# Patient Record
Sex: Female | Born: 1975 | Race: White | Hispanic: No | Marital: Single | Smoking: Current every day smoker
Health system: Southern US, Community
[De-identification: ages and names within clinical notes are randomized; demographics above are authoritative.]

## PROBLEM LIST (undated history)

## (undated) DIAGNOSIS — F410 Panic disorder [episodic paroxysmal anxiety] without agoraphobia: Secondary | ICD-10-CM

## (undated) DIAGNOSIS — F419 Anxiety disorder, unspecified: Secondary | ICD-10-CM

## (undated) DIAGNOSIS — K5790 Diverticulosis of intestine, part unspecified, without perforation or abscess without bleeding: Secondary | ICD-10-CM

## (undated) HISTORY — PX: OOPHORECTOMY: SHX86

---

## 2003-11-22 ENCOUNTER — Emergency Department: Payer: Self-pay | Admitting: Emergency Medicine

## 2004-02-09 ENCOUNTER — Emergency Department: Payer: Self-pay | Admitting: Emergency Medicine

## 2004-03-24 ENCOUNTER — Emergency Department: Payer: Self-pay | Admitting: Emergency Medicine

## 2004-06-19 ENCOUNTER — Emergency Department: Payer: Self-pay | Admitting: Emergency Medicine

## 2004-12-20 ENCOUNTER — Emergency Department: Payer: Self-pay | Admitting: Emergency Medicine

## 2005-05-24 ENCOUNTER — Emergency Department: Payer: Self-pay | Admitting: Emergency Medicine

## 2006-03-01 ENCOUNTER — Other Ambulatory Visit: Payer: Self-pay

## 2006-03-01 ENCOUNTER — Emergency Department: Payer: Self-pay | Admitting: Emergency Medicine

## 2006-04-04 ENCOUNTER — Ambulatory Visit: Payer: Self-pay | Admitting: Family Medicine

## 2006-09-20 ENCOUNTER — Emergency Department: Payer: Self-pay | Admitting: Internal Medicine

## 2006-12-24 ENCOUNTER — Emergency Department: Payer: Self-pay | Admitting: Emergency Medicine

## 2009-12-10 ENCOUNTER — Emergency Department (HOSPITAL_BASED_OUTPATIENT_CLINIC_OR_DEPARTMENT_OTHER): Admission: EM | Admit: 2009-12-10 | Discharge: 2009-12-10 | Payer: Self-pay | Admitting: Emergency Medicine

## 2009-12-10 ENCOUNTER — Ambulatory Visit: Payer: Self-pay | Admitting: Interventional Radiology

## 2010-02-10 ENCOUNTER — Emergency Department (HOSPITAL_BASED_OUTPATIENT_CLINIC_OR_DEPARTMENT_OTHER)
Admission: EM | Admit: 2010-02-10 | Discharge: 2010-02-10 | Payer: Self-pay | Source: Home / Self Care | Admitting: Emergency Medicine

## 2010-03-16 ENCOUNTER — Emergency Department (HOSPITAL_BASED_OUTPATIENT_CLINIC_OR_DEPARTMENT_OTHER)
Admission: EM | Admit: 2010-03-16 | Discharge: 2010-03-16 | Disposition: A | Discharge status: Home / Self Care | Payer: Self-pay | Attending: Emergency Medicine | Admitting: Emergency Medicine

## 2010-03-16 DIAGNOSIS — J45909 Unspecified asthma, uncomplicated: Secondary | ICD-10-CM | POA: Insufficient documentation

## 2010-03-16 DIAGNOSIS — M549 Dorsalgia, unspecified: Secondary | ICD-10-CM | POA: Insufficient documentation

## 2010-03-16 DIAGNOSIS — IMO0001 Reserved for inherently not codable concepts without codable children: Secondary | ICD-10-CM | POA: Insufficient documentation

## 2010-03-16 DIAGNOSIS — F17200 Nicotine dependence, unspecified, uncomplicated: Secondary | ICD-10-CM | POA: Insufficient documentation

## 2010-05-10 ENCOUNTER — Emergency Department (HOSPITAL_BASED_OUTPATIENT_CLINIC_OR_DEPARTMENT_OTHER)
Admission: EM | Admit: 2010-05-10 | Discharge: 2010-05-10 | Disposition: A | Discharge status: Home / Self Care | Payer: Self-pay | Attending: Emergency Medicine | Admitting: Emergency Medicine

## 2010-05-10 DIAGNOSIS — N9489 Other specified conditions associated with female genital organs and menstrual cycle: Secondary | ICD-10-CM | POA: Insufficient documentation

## 2010-05-10 DIAGNOSIS — N946 Dysmenorrhea, unspecified: Secondary | ICD-10-CM | POA: Insufficient documentation

## 2010-05-10 DIAGNOSIS — J45909 Unspecified asthma, uncomplicated: Secondary | ICD-10-CM | POA: Insufficient documentation

## 2010-05-10 LAB — URINALYSIS, ROUTINE W REFLEX MICROSCOPIC
Ketones, ur: NEGATIVE mg/dL
Nitrite: NEGATIVE
pH: 5.5 (ref 5.0–8.0)

## 2010-06-20 ENCOUNTER — Emergency Department (INDEPENDENT_AMBULATORY_CARE_PROVIDER_SITE_OTHER): Payer: Self-pay

## 2010-06-20 ENCOUNTER — Emergency Department (HOSPITAL_BASED_OUTPATIENT_CLINIC_OR_DEPARTMENT_OTHER)
Admission: EM | Admit: 2010-06-20 | Discharge: 2010-06-20 | Disposition: A | Discharge status: Home / Self Care | Payer: Self-pay | Attending: Emergency Medicine | Admitting: Emergency Medicine

## 2010-06-20 DIAGNOSIS — F17200 Nicotine dependence, unspecified, uncomplicated: Secondary | ICD-10-CM | POA: Insufficient documentation

## 2010-06-20 DIAGNOSIS — S61209A Unspecified open wound of unspecified finger without damage to nail, initial encounter: Secondary | ICD-10-CM | POA: Insufficient documentation

## 2010-06-20 DIAGNOSIS — J45909 Unspecified asthma, uncomplicated: Secondary | ICD-10-CM | POA: Insufficient documentation

## 2011-01-05 ENCOUNTER — Emergency Department (HOSPITAL_BASED_OUTPATIENT_CLINIC_OR_DEPARTMENT_OTHER)
Admission: EM | Admit: 2011-01-05 | Discharge: 2011-01-05 | Disposition: A | Discharge status: Home / Self Care | Payer: Self-pay | Attending: Emergency Medicine | Admitting: Emergency Medicine

## 2011-01-05 ENCOUNTER — Encounter: Payer: Self-pay | Admitting: Family Medicine

## 2011-01-05 DIAGNOSIS — J45909 Unspecified asthma, uncomplicated: Secondary | ICD-10-CM | POA: Insufficient documentation

## 2011-01-05 DIAGNOSIS — R51 Headache: Secondary | ICD-10-CM | POA: Insufficient documentation

## 2011-01-05 DIAGNOSIS — J111 Influenza due to unidentified influenza virus with other respiratory manifestations: Secondary | ICD-10-CM | POA: Insufficient documentation

## 2011-01-05 MED ORDER — ALBUTEROL SULFATE HFA 108 (90 BASE) MCG/ACT IN AERS
2.0000 | INHALATION_SPRAY | Freq: Four times a day (QID) | RESPIRATORY_TRACT | Status: DC
  Administered 2011-01-05: 19:00:00 via RESPIRATORY_TRACT

## 2011-01-05 MED ORDER — ALBUTEROL SULFATE HFA 108 (90 BASE) MCG/ACT IN AERS
INHALATION_SPRAY | RESPIRATORY_TRACT | Status: AC
  Filled 2011-01-05: qty 6.7

## 2011-01-05 MED ORDER — OXYCODONE-ACETAMINOPHEN 5-325 MG PO TABS: 2.0000 | ORAL_TABLET | ORAL | Status: AC | PRN

## 2011-01-05 NOTE — ED Provider Notes (Signed)
History     CSN: [id number] Arrival date & time: 01/05/2011  6:33 PM   First MD Initiated Contact with Patient 01/05/11 1836      Chief Complaint  Patient presents with  . Sore Throat  . Headache   35 year old female with a history of asthma. She states that she began having sore throat and body aches and "fever" approximately 2 days ago. She's also had generalized malaise and cough. She states her coworker recently had influenza. The patient is afebrile in triage. She's had no difficulty swallowing, no chest pain, no neck pain, no rash.  (Consider location/radiation/quality/duration/timing/severity/associated sxs/prior treatment) HPI  Past Medical History  Diagnosis Date  . Asthma     Past Surgical History  Procedure Date  . Oophorectomy     No family history on file.  History  Substance Use Topics  . Smoking status: Current Everyday Smoker  . Smokeless tobacco: Not on file  . Alcohol Use: No    OB History    Grav Para Term Preterm Abortions TAB SAB Ect Mult Living                  Review of Systems  All other systems reviewed and are negative.    Allergies  Review of patient's allergies indicates no known allergies.  Home Medications   Current Outpatient Rx  Name Route Sig Dispense Refill  . ALBUTEROL SULFATE (2.5 MG/3ML) 0.083% IN NEBU Nebulization Take 2.5 mg by nebulization every 6 (six) hours as needed. For shortness of breath and wheezing    . FLUTICASONE PROPIONATE 50 MCG/ACT NA SUSP Nasal Place 2 sprays into the nose daily.      . IBUPROFEN 200 MG PO TABS Oral Take 800 mg by mouth every 6 (six) hours as needed. For pain     . LORATADINE 10 MG PO TABS Oral Take 10 mg by mouth daily.        BP 106/68  Pulse 76  Temp(Src) 98.3 F (36.8 C) (Oral)  Resp 16  Ht 5\' 5"  (1.651 m)  Wt 165 lb (74.844 kg)  BMI 27.46 kg/m2  SpO2 100%  LMP 11/26/2010  Physical Exam  Nursing note and vitals reviewed. Constitutional: She is oriented to person, place,  and time. She appears well-developed and well-nourished.  HENT:  Head: Normocephalic and atraumatic.  Mouth/Throat: Oropharynx is clear and moist. No oropharyngeal exudate.  Eyes: Conjunctivae and EOM are normal. Pupils are equal, round, and reactive to light.  Neck: Neck supple.  Cardiovascular: Normal rate and regular rhythm.  Exam reveals no gallop and no friction rub.   No murmur heard. Pulmonary/Chest: Effort normal and breath sounds normal. No respiratory distress. She has no wheezes. She has no rales. She exhibits no tenderness.  Abdominal: Soft. Bowel sounds are normal. She exhibits no distension. There is no tenderness. There is no rebound and no guarding.  Musculoskeletal: Normal range of motion.  Lymphadenopathy:    She has no cervical adenopathy.  Neurological: She is alert and oriented to person, place, and time. No cranial nerve deficit. Coordination normal.  Skin: Skin is warm and dry. No rash noted.  Psychiatric: She has a normal mood and affect.    ED Course  Procedures (including critical care time)  Labs Reviewed - No data to display No results found.   No diagnosis found.    MDM  Pt is seen and examined;  Initial history and physical completed.  Will follow.  Juni Glaab A. Patrica Duel, MD 01/05/11 0454

## 2011-01-05 NOTE — ED Notes (Signed)
Pt c/o sore throat, headache and fever onset today. Pt sts she took ibuprofen 2 hrs ago.

## 2011-03-25 ENCOUNTER — Emergency Department (HOSPITAL_BASED_OUTPATIENT_CLINIC_OR_DEPARTMENT_OTHER)
Admission: EM | Admit: 2011-03-25 | Discharge: 2011-03-25 | Disposition: A | Discharge status: Home / Self Care | Payer: Self-pay | Attending: Emergency Medicine | Admitting: Emergency Medicine

## 2011-03-25 ENCOUNTER — Encounter (HOSPITAL_BASED_OUTPATIENT_CLINIC_OR_DEPARTMENT_OTHER): Payer: Self-pay | Admitting: *Deleted

## 2011-03-25 DIAGNOSIS — J329 Chronic sinusitis, unspecified: Secondary | ICD-10-CM | POA: Insufficient documentation

## 2011-03-25 DIAGNOSIS — J45909 Unspecified asthma, uncomplicated: Secondary | ICD-10-CM | POA: Insufficient documentation

## 2011-03-25 DIAGNOSIS — F17200 Nicotine dependence, unspecified, uncomplicated: Secondary | ICD-10-CM | POA: Insufficient documentation

## 2011-03-25 MED ORDER — AMOXICILLIN 500 MG PO CAPS: 500.0000 mg | ORAL_CAPSULE | Freq: Three times a day (TID) | ORAL | Status: AC

## 2011-03-25 NOTE — ED Notes (Signed)
Facial pain, head pain, nasal congestion, lightheaded, dizziness, and ear pain. Symptoms x 1 week.

## 2011-03-25 NOTE — Discharge Instructions (Signed)
Sinusitis Sinuses are air pockets within the bones of your face. The growth of bacteria within a sinus leads to infection. The infection prevents the sinuses from draining. This infection is called sinusitis. SYMPTOMS   There will be different areas of pain depending on which sinuses have become infected.  The maxillary sinuses often produce pain beneath the eyes.     Frontal sinusitis may cause pain in the middle of the forehead and above the eyes.  Other problems (symptoms) include:  Toothaches.     Colored, pus-like (purulent) drainage from the nose.     Swelling, warmth, and tenderness over the sinus areas may be signs of infection.  TREATMENT   Sinusitis is most often determined by an exam.X-rays may be taken. If x-rays have been taken, make sure you obtain your results or find out how you are to obtain them. Your caregiver may give you medications (antibiotics). These are medications that will help kill the bacteria causing the infection. You may also be given a medication (decongestant) that helps to reduce sinus swelling.   HOME CARE INSTRUCTIONS    Only take over-the-counter or prescription medicines for pain, discomfort, or fever as directed by your caregiver.     Drink extra fluids. Fluids help thin the mucus so your sinuses can drain more easily.     Applying either moist heat or ice packs to the sinus areas may help relieve discomfort.     Use saline nasal sprays to help moisten your sinuses. The sprays can be found at your local drugstore.  SEEK IMMEDIATE MEDICAL CARE IF:  You have a fever.     You have increasing pain, severe headaches, or toothache.     You have nausea, vomiting, or drowsiness.     You develop unusual swelling around the face or trouble seeing.  MAKE SURE YOU:    Understand these instructions.     Will watch your condition.     Will get help right away if you are not doing well or get worse.  Document Released: 01/10/2005 Document Revised:  09/22/2010 Document Reviewed: 08/09/2006 [employer] Patient Information 2012 [employer].Sinusitis Sinuses are air pockets within the bones of your face. The growth of bacteria within a sinus leads to infection. The infection prevents the sinuses from draining. This infection is called sinusitis. SYMPTOMS   There will be different areas of pain depending on which sinuses have become infected.  The maxillary sinuses often produce pain beneath the eyes.     Frontal sinusitis may cause pain in the middle of the forehead and above the eyes.  Other problems (symptoms) include:  Toothaches.     Colored, pus-like (purulent) drainage from the nose.     Swelling, warmth, and tenderness over the sinus areas may be signs of infection.  TREATMENT   Sinusitis is most often determined by an exam.X-rays may be taken. If x-rays have been taken, make sure you obtain your results or find out how you are to obtain them. Your caregiver may give you medications (antibiotics). These are medications that will help kill the bacteria causing the infection. You may also be given a medication (decongestant) that helps to reduce sinus swelling.   HOME CARE INSTRUCTIONS    Only take over-the-counter or prescription medicines for pain, discomfort, or fever as directed by your caregiver.     Drink extra fluids. Fluids help thin the mucus so your sinuses can drain more easily.     Applying either moist heat or ice   packs to the sinus areas may help relieve discomfort.     Use saline nasal sprays to help moisten your sinuses. The sprays can be found at your local drugstore.  SEEK IMMEDIATE MEDICAL CARE IF:  You have a fever.     You have increasing pain, severe headaches, or toothache.     You have nausea, vomiting, or drowsiness.     You develop unusual swelling around the face or trouble seeing.  MAKE SURE YOU:    Understand these instructions.     Will watch your condition.     Will get help right away  if you are not doing well or get worse.  Document Released: 01/10/2005 Document Revised: 09/22/2010 Document Reviewed: 08/09/2006 [employer] Patient Information 2012 [employer]. 

## 2011-03-25 NOTE — ED Provider Notes (Signed)
History     CSN: [id number]  Arrival date & time 03/25/11  1928   First MD Initiated Contact with Patient 03/25/11 1950      Chief Complaint  Patient presents with  . URI    (Consider location/radiation/quality/duration/timing/severity/associated sxs/prior treatment) Patient is a 36 y.o. female presenting with cough. The history is provided by the patient. No language interpreter was used.  Cough This is a new problem. The current episode started more than 1 week ago. The problem occurs constantly. The problem has been gradually worsening. The cough is productive of brown sputum. There has been no fever. The fever has been present for 5 days or more. Associated symptoms include ear pain. She has tried decongestants for the symptoms. The treatment provided no relief. Risk factors: none. She is a smoker. Her past medical history is significant for asthma. Her past medical history does not include pneumonia.    Past Medical History  Diagnosis Date  . Asthma     Past Surgical History  Procedure Date  . Oophorectomy     No family history on file.  History  Substance Use Topics  . Smoking status: Current Everyday Smoker  . Smokeless tobacco: Not on file  . Alcohol Use: No    OB History    Grav Para Term Preterm Abortions TAB SAB Ect Mult Living                  Review of Systems  HENT: Positive for ear pain.   Respiratory: Positive for cough.   All other systems reviewed and are negative.    Allergies  Review of patient's allergies indicates no known allergies.  Home Medications   Current Outpatient Rx  Name Route Sig Dispense Refill  . FLUTICASONE PROPIONATE 50 MCG/ACT NA SUSP Nasal Place 2 sprays into the nose daily.      . IBUPROFEN 200 MG PO TABS Oral Take 800 mg by mouth every 6 (six) hours as needed. For pain     . LORATADINE 10 MG PO TABS Oral Take 10 mg by mouth daily.      . ALBUTEROL SULFATE (2.5 MG/3ML) 0.083% IN NEBU Nebulization Take 2.5 mg by  nebulization every 6 (six) hours as needed. For shortness of breath and wheezing      BP 113/57  Pulse 83  Temp(Src) 98 F (36.7 C) (Oral)  Resp 20  SpO2 98%  Physical Exam  Nursing note and vitals reviewed. Constitutional: She is oriented to person, place, and time. She appears well-developed and well-nourished.  HENT:  Head: Normocephalic and atraumatic.  Right Ear: External ear normal.  Left Ear: External ear normal.  Nose: Nose normal.  Mouth/Throat: Oropharynx is clear and moist.       Tender bilat maxillary sinuses  Eyes: Conjunctivae and EOM are normal. Pupils are equal, round, and reactive to light.  Neck: Normal range of motion. Neck supple.  Cardiovascular: Normal rate.   Pulmonary/Chest: Effort normal.  Abdominal: Soft.  Musculoskeletal: Normal range of motion.  Neurological: She is alert and oriented to person, place, and time. She has normal reflexes.  Skin: Skin is warm.  Psychiatric: She has a normal mood and affect.    ED Course  Procedures (including critical care time)  Labs Reviewed - No data to display No results found.   No diagnosis found.    MDM   Results for orders placed during the hospital encounter of 05/10/10  URINALYSIS, ROUTINE W REFLEX MICROSCOPIC  Component Value Range   Color, Urine YELLOW  YELLOW    APPearance CLOUDY (*) CLEAR    Specific Gravity, Urine 1.026  1.005 - 1.030    pH 5.5  5.0 - 8.0    Glucose, UA NEGATIVE  NEGATIVE (mg/dL)   Hgb urine dipstick NEGATIVE  NEGATIVE    Bilirubin Urine NEGATIVE  NEGATIVE    Ketones, ur NEGATIVE  NEGATIVE (mg/dL)   Protein, ur NEGATIVE  NEGATIVE (mg/dL)   Urobilinogen, UA 1.0  0.0 - 1.0 (mg/dL)   Nitrite NEGATIVE  NEGATIVE    Leukocytes, UA    NEGATIVE    Value: NEGATIVE MICROSCOPIC NOT DONE ON URINES WITH NEGATIVE PROTEIN, BLOOD, LEUKOCYTES, NITRITE, OR GLUCOSE <1000 mg/dL.  PREGNANCY, URINE      Component Value Range   Preg Test, Ur       Value: NEGATIVE            THE  SENSITIVITY OF THIS     METHODOLOGY IS >24 mIU/mL   No results found.         Langston Masker, [place] 03/25/11 2032

## 2011-03-25 NOTE — ED Provider Notes (Signed)
Medical screening examination/treatment/procedure(s) were performed by non-physician practitioner and as supervising physician I was immediately available for consultation/collaboration.   Javari Bufkin, MD 03/25/11 2339 

## 2011-04-12 ENCOUNTER — Encounter (HOSPITAL_BASED_OUTPATIENT_CLINIC_OR_DEPARTMENT_OTHER): Payer: Self-pay | Admitting: *Deleted

## 2011-04-12 ENCOUNTER — Emergency Department (HOSPITAL_BASED_OUTPATIENT_CLINIC_OR_DEPARTMENT_OTHER)
Admission: EM | Admit: 2011-04-12 | Discharge: 2011-04-12 | Disposition: A | Discharge status: Home / Self Care | Payer: Self-pay | Attending: Emergency Medicine | Admitting: Emergency Medicine

## 2011-04-12 DIAGNOSIS — J45909 Unspecified asthma, uncomplicated: Secondary | ICD-10-CM | POA: Insufficient documentation

## 2011-04-12 DIAGNOSIS — J019 Acute sinusitis, unspecified: Secondary | ICD-10-CM

## 2011-04-12 DIAGNOSIS — F17200 Nicotine dependence, unspecified, uncomplicated: Secondary | ICD-10-CM | POA: Insufficient documentation

## 2011-04-12 DIAGNOSIS — R070 Pain in throat: Secondary | ICD-10-CM | POA: Insufficient documentation

## 2011-04-12 MED ORDER — SULFAMETHOXAZOLE-TRIMETHOPRIM 800-160 MG PO TABS: 1.0000 | ORAL_TABLET | Freq: Two times a day (BID) | ORAL | Status: DC

## 2011-04-12 MED ORDER — TRAMADOL HCL 50 MG PO TABS: 50.0000 mg | ORAL_TABLET | Freq: Four times a day (QID) | ORAL | Status: DC | PRN

## 2011-04-12 MED ORDER — SULFAMETHOXAZOLE-TRIMETHOPRIM 800-160 MG PO TABS: 1.0000 | ORAL_TABLET | Freq: Two times a day (BID) | ORAL | Status: AC

## 2011-04-12 MED ORDER — TRAMADOL HCL 50 MG PO TABS: 50.0000 mg | ORAL_TABLET | Freq: Four times a day (QID) | ORAL | Status: AC | PRN

## 2011-04-12 NOTE — ED Notes (Signed)
Reports treated for same symptoms one month ago took antibiotics prescribed as soon as done with meds symptoms came back

## 2011-04-12 NOTE — ED Provider Notes (Signed)
History     CSN: [id number]  Arrival date & time 04/12/11  1141   First MD Initiated Contact with Patient 04/12/11 1310      Chief Complaint  Patient presents with  . Nasal Congestion  . Sore Throat    (Consider location/radiation/quality/duration/timing/severity/associated sxs/prior treatment) Patient is a 36 y.o. female presenting with pharyngitis. The history is provided by the patient. No language interpreter was used.  Sore Throat This is a recurrent problem. The current episode started more than 1 month ago. The problem occurs constantly. The problem has been unchanged. Associated symptoms include a sore throat. Pertinent negatives include no fever. The symptoms are aggravated by coughing and smoking. She has tried nothing for the symptoms. The treatment provided no relief.  Pt complains of continued sorethroat and sinus congestion.   Past Medical History  Diagnosis Date  . Asthma     Past Surgical History  Procedure Date  . Oophorectomy     History reviewed. No pertinent family history.  History  Substance Use Topics  . Smoking status: Current Everyday Smoker  . Smokeless tobacco: Not on file  . Alcohol Use: No    OB History    Grav Para Term Preterm Abortions TAB SAB Ect Mult Living                  Review of Systems  Constitutional: Negative for fever.  HENT: Positive for sore throat.   All other systems reviewed and are negative.    Allergies  Review of patient's allergies indicates no known allergies.  Home Medications   Current Outpatient Rx  Name Route Sig Dispense Refill  . ALBUTEROL SULFATE (2.5 MG/3ML) 0.083% IN NEBU Nebulization Take 2.5 mg by nebulization every 6 (six) hours as needed. For shortness of breath and wheezing    . FLUTICASONE PROPIONATE 50 MCG/ACT NA SUSP Nasal Place 2 sprays into the nose daily.      . IBUPROFEN 200 MG PO TABS Oral Take 800 mg by mouth every 6 (six) hours as needed. For pain     . LORATADINE 10 MG PO TABS  Oral Take 10 mg by mouth daily.        BP 108/63  Pulse 81  Temp(Src) 98.2 F (36.8 C) (Oral)  Resp 20  Wt 182 lb (82.555 kg)  SpO2 99%  LMP 03/29/2011  Physical Exam  Vitals reviewed. Constitutional: She appears well-developed and well-nourished.  HENT:  Head: Normocephalic and atraumatic.  Right Ear: External ear normal.  Left Ear: External ear normal.  Nose: Nose normal.  Mouth/Throat: Oropharynx is clear and moist.  Eyes: Conjunctivae are normal. Pupils are equal, round, and reactive to light.  Neck: Normal range of motion. Neck supple.  Cardiovascular: Normal rate.   Pulmonary/Chest: Effort normal.  Abdominal: Soft.  Musculoskeletal: Normal range of motion.  Neurological: She is alert.  Skin: Skin is warm.  Psychiatric: She has a normal mood and affect.    ED Course  Procedures (including critical care time)  Labs Reviewed - No data to display No results found.   No diagnosis found.    MDM  I will treat with bactim.  And ultram for symptoms.  I will refer to Dr. Suszanne Conners for evaluation        Emily Stuart, [place] 04/12/11 [phone]

## 2011-04-12 NOTE — ED Provider Notes (Signed)
Medical screening examination/treatment/procedure(s) were performed by non-physician practitioner and as supervising physician I was immediately available for consultation/collaboration.   Rolan Bucco, MD 04/12/11 [phone]

## 2011-04-12 NOTE — Discharge Instructions (Signed)

## 2011-11-12 ENCOUNTER — Emergency Department (HOSPITAL_BASED_OUTPATIENT_CLINIC_OR_DEPARTMENT_OTHER)
Admission: EM | Admit: 2011-11-12 | Discharge: 2011-11-13 | Disposition: A | Discharge status: Home / Self Care | Payer: Self-pay | Attending: Emergency Medicine | Admitting: Emergency Medicine

## 2011-11-12 ENCOUNTER — Encounter (HOSPITAL_BASED_OUTPATIENT_CLINIC_OR_DEPARTMENT_OTHER): Payer: Self-pay | Admitting: *Deleted

## 2011-11-12 DIAGNOSIS — F411 Generalized anxiety disorder: Secondary | ICD-10-CM | POA: Insufficient documentation

## 2011-11-12 DIAGNOSIS — Z79899 Other long term (current) drug therapy: Secondary | ICD-10-CM | POA: Insufficient documentation

## 2011-11-12 DIAGNOSIS — F17200 Nicotine dependence, unspecified, uncomplicated: Secondary | ICD-10-CM | POA: Insufficient documentation

## 2011-11-12 DIAGNOSIS — F4320 Adjustment disorder, unspecified: Secondary | ICD-10-CM

## 2011-11-12 DIAGNOSIS — J45909 Unspecified asthma, uncomplicated: Secondary | ICD-10-CM | POA: Insufficient documentation

## 2011-11-12 HISTORY — DX: Anxiety disorder, unspecified: F41.9

## 2011-11-12 HISTORY — DX: Panic disorder (episodic paroxysmal anxiety): F41.0

## 2011-11-12 NOTE — ED Notes (Signed)
Pt states she has been feeling anxious for about a month, but has gotten worse the past few days. "Going through a lot". Denies suicidal thoughts. Tearful.

## 2011-11-13 MED ORDER — LORAZEPAM 1 MG PO TABS: 1.0000 mg | ORAL_TABLET | Freq: Every evening | ORAL | Status: AC | PRN

## 2011-11-13 NOTE — ED Notes (Signed)
rx x 1 given for ativan with reference guide

## 2011-11-13 NOTE — ED Provider Notes (Signed)
History  This chart was scribed for Emily Seamen, MD by Albertha Ghee Rifaie. This patient was seen in room MH09/MH09 and the patient's care was started at 12:30PM.    CSN: [id number]  Arrival date & time 11/12/11  2248   None     Chief Complaint  Patient presents with  . Panic Attack     HPI  Emily Stuart is a 36 y.o. female who presents to the Emergency Department complaining of a month of feeling anxious that got worse the past few days that is associated with trouble sleeping and poor appetite, SOB (when feeling especially anxious) and diarrhea. Pt has a remote hx of panic attack with similar symptoms. She reports having a lot of stress at home and having personal problems. Pt is tearful. She denies SI/HI. She doesn't have a PCP. Pt is a current everyday smoker but denies alcohol or drug use. The symptoms are moderate to severe and she is requesting something to help her sleep as her insomnia is affecting her work.    Past Medical History  Diagnosis Date  . Asthma   . Anxiety   . Panic attacks     Past Surgical History  Procedure Date  . Oophorectomy     History reviewed. No pertinent family history.  History  Substance Use Topics  . Smoking status: Current Every Day Smoker  . Smokeless tobacco: Not on file  . Alcohol Use: No   No OB history provided.  Review of Systems  All other systems reviewed and are negative.    Allergies  Review of patient's allergies indicates no known allergies.  Home Medications   Current Outpatient Rx  Name Route Sig Dispense Refill  . ALBUTEROL SULFATE (2.5 MG/3ML) 0.083% IN NEBU Nebulization Take 2.5 mg by nebulization every 6 (six) hours as needed. For shortness of breath and wheezing    . FLUTICASONE PROPIONATE 50 MCG/ACT NA SUSP Nasal Place 2 sprays into the nose daily.      . IBUPROFEN 200 MG PO TABS Oral Take 800 mg by mouth every 6 (six) hours as needed. For pain     . LORATADINE 10 MG PO TABS Oral Take 10 mg by  mouth daily.        BP 127/80  Pulse 102  Temp 98.3 F (36.8 C) (Oral)  Resp 20  Ht 5\' 5"  (1.651 m)  Wt 155 lb (70.308 kg)  BMI 25.79 kg/m2  SpO2 98%  LMP 10/11/2011  Physical Exam General: Well-developed, well-nourished female in no acute distress; appearance consistent with age of record HENT: normocephalic, atraumatic Eyes: pupils equal round and reactive to light; extraocular muscles intact Neck: supple Heart: regular rate and rhythm; no murmurs, rubs or gallops Lungs: clear to auscultation bilaterally Abdomen: soft; nondistended; nontender; no masses or hepatosplenomegaly; bowel sounds present Extremities: No deformity; full range of motion; pulses normal; no edema Neurologic: Awake, alert and oriented; motor function intact in all extremities and symmetric; no facial droop Skin: Warm and dry Psychiatric:  Tearful; depressed with congruent affect; no SI/HI  ED Course  Procedures (including critical care time)   DIAGNOSTIC STUDIES: Oxygen Saturation is 98% on room air, normal by my interpretation.    COORDINATION OF CARE: PM Discussed treatment plan with pt at bedside and pt agreed to plan.   MDM  We'll refer her for outpatient behavioral health followup. She denies drug use, suicidal ideation or homicidal ideation at this time. She was advised to return should this change.  I personally performed the services described in this documentation, which was scribed in my presence.  The recorded information has been reviewed and considered.    Emily Seamen, MD 11/13/11 [phone]

## 2011-12-12 ENCOUNTER — Encounter (HOSPITAL_BASED_OUTPATIENT_CLINIC_OR_DEPARTMENT_OTHER): Payer: Self-pay | Admitting: *Deleted

## 2011-12-12 ENCOUNTER — Emergency Department (HOSPITAL_BASED_OUTPATIENT_CLINIC_OR_DEPARTMENT_OTHER)
Admission: EM | Admit: 2011-12-12 | Discharge: 2011-12-12 | Disposition: A | Discharge status: Home / Self Care | Payer: 59 | Attending: Emergency Medicine | Admitting: Emergency Medicine

## 2011-12-12 DIAGNOSIS — J069 Acute upper respiratory infection, unspecified: Secondary | ICD-10-CM | POA: Insufficient documentation

## 2011-12-12 DIAGNOSIS — F410 Panic disorder [episodic paroxysmal anxiety] without agoraphobia: Secondary | ICD-10-CM | POA: Insufficient documentation

## 2011-12-12 DIAGNOSIS — J45909 Unspecified asthma, uncomplicated: Secondary | ICD-10-CM | POA: Insufficient documentation

## 2011-12-12 DIAGNOSIS — R51 Headache: Secondary | ICD-10-CM | POA: Insufficient documentation

## 2011-12-12 DIAGNOSIS — F17200 Nicotine dependence, unspecified, uncomplicated: Secondary | ICD-10-CM | POA: Insufficient documentation

## 2011-12-12 DIAGNOSIS — F411 Generalized anxiety disorder: Secondary | ICD-10-CM | POA: Insufficient documentation

## 2011-12-12 MED ORDER — TRAMADOL HCL 50 MG PO TABS: 50.0000 mg | ORAL_TABLET | Freq: Four times a day (QID) | ORAL | Status: AC | PRN

## 2011-12-12 MED ORDER — PSEUDOEPHEDRINE HCL 60 MG PO TABS: 60.0000 mg | ORAL_TABLET | Freq: Four times a day (QID) | ORAL | Status: AC | PRN

## 2011-12-12 NOTE — ED Notes (Signed)
Pt c/o head congestion h/a and facial pressure x 3 days

## 2011-12-12 NOTE — ED Notes (Signed)
Pt reports heqd pressure pain OTC med ineffective Day QUIL

## 2011-12-12 NOTE — ED Provider Notes (Signed)
History  This chart was scribed for Emily Girardot B. Bernette Mayers, MD by Ardeen Jourdain, ED Scribe. This patient was seen in room MH01/MH01 and the patient's care was started at 2225.  CSN: [id number]  Arrival date & time 12/12/11  2214   First MD Initiated Contact with Patient 12/12/11 2225      Chief Complaint  Patient presents with  . Nasal Congestion  . Headache     The history is provided by the patient. No language interpreter was used.    Cassandria Stuart is a 36 y.o. female who presents to the Emergency Department complaining of head congestion with associated sore throat, nausea and facial pressure. She denies fever, rash, emesis, diarrhea, urinary incontinence, bowel incontinence and cough. She states the pain started 2 days ago and has been gradually worsening. She reports that the pain is located in her forehead and around her eyes. She denies sick contact. She reports taking Dayquil with no relief. She has a h/o asthma, anxiety and panic attacks. She is a current everyday smoker but denies alcohol use.   Past Medical History  Diagnosis Date  . Asthma   . Anxiety   . Panic attacks     Past Surgical History  Procedure Date  . Oophorectomy     History reviewed. No pertinent family history.  History  Substance Use Topics  . Smoking status: Current Every Day Smoker -- 1.0 packs/day  . Smokeless tobacco: Not on file  . Alcohol Use: No   No OB history available.   Review of Systems  All other systems reviewed and are negative.    Allergies  Review of patient's allergies indicates no known allergies.  Home Medications   Current Outpatient Rx  Name  Route  Sig  Dispense  Refill  . ALBUTEROL SULFATE (2.5 MG/3ML) 0.083% IN NEBU   Nebulization   Take 2.5 mg by nebulization every 6 (six) hours as needed. For shortness of breath and wheezing         . FLUTICASONE PROPIONATE 50 MCG/ACT NA SUSP   Nasal   Place 2 sprays into the nose daily.           .  IBUPROFEN 200 MG PO TABS   Oral   Take 800 mg by mouth every 6 (six) hours as needed. For pain          . LORATADINE 10 MG PO TABS   Oral   Take 10 mg by mouth daily.           Marland Kitchen LORAZEPAM 1 MG PO TABS   Oral   Take 1-2 tablets (1-2 mg total) by mouth at bedtime as needed (for sleep).   15 tablet   0     Triage Vitals: BP 122/68  Pulse 80  Temp 98.2 F (36.8 C) (Oral)  Resp 18  Ht 5\' 4"  (1.626 m)  Wt 155 lb (70.308 kg)  BMI 26.61 kg/m2  SpO2 98%  LMP 11/14/2011  Physical Exam  Nursing note and vitals reviewed. Constitutional: She is oriented to person, place, and time. She appears well-developed and well-nourished.  HENT:  Head: Normocephalic and atraumatic.       No tenderness to percussion in maxillary frontal sinus  Eyes: EOM are normal. Pupils are equal, round, and reactive to light.  Neck: Normal range of motion. Neck supple.  Cardiovascular: Normal rate, normal heart sounds and intact distal pulses.   Pulmonary/Chest: Effort normal and breath sounds normal.  Abdominal: Bowel sounds are normal.  She exhibits no distension. There is no tenderness.  Musculoskeletal: Normal range of motion. She exhibits no edema and no tenderness.  Lymphadenopathy:    She has no cervical adenopathy.  Neurological: She is alert and oriented to person, place, and time. She has normal strength. No cranial nerve deficit or sensory deficit.  Skin: Skin is warm and dry. No rash noted.  Psychiatric: She has a normal mood and affect.    ED Course  Procedures (including critical care time)  DIAGNOSTIC STUDIES: Oxygen Saturation is 98% on room air, normal by my interpretation.    COORDINATION OF CARE:  10:27 PM: Discussed treatment plan with pt at bedside and pt agreed to plan.    Labs Reviewed - No data to display No results found.   No diagnosis found.    MDM  Exam unremarkable. Symptoms for 2 days without concern for bacterial sinusitis. Advised symptomatic treatment.  PCP followup.       I personally performed the services described in this documentation, which was scribed in my presence. The recorded information has been reviewed and is accurate.     Faizon Capozzi B. Bernette Mayers, MD 12/12/11 2236

## 2011-12-29 ENCOUNTER — Other Ambulatory Visit: Payer: Self-pay

## 2011-12-29 ENCOUNTER — Emergency Department (HOSPITAL_BASED_OUTPATIENT_CLINIC_OR_DEPARTMENT_OTHER)
Admission: EM | Admit: 2011-12-29 | Discharge: 2011-12-29 | Disposition: A | Discharge status: Home / Self Care | Payer: 59 | Attending: Emergency Medicine | Admitting: Emergency Medicine

## 2011-12-29 ENCOUNTER — Encounter (HOSPITAL_BASED_OUTPATIENT_CLINIC_OR_DEPARTMENT_OTHER): Payer: Self-pay | Admitting: *Deleted

## 2011-12-29 DIAGNOSIS — F411 Generalized anxiety disorder: Secondary | ICD-10-CM | POA: Insufficient documentation

## 2011-12-29 DIAGNOSIS — R110 Nausea: Secondary | ICD-10-CM

## 2011-12-29 DIAGNOSIS — J45909 Unspecified asthma, uncomplicated: Secondary | ICD-10-CM | POA: Insufficient documentation

## 2011-12-29 DIAGNOSIS — R5381 Other malaise: Secondary | ICD-10-CM | POA: Insufficient documentation

## 2011-12-29 DIAGNOSIS — R51 Headache: Secondary | ICD-10-CM | POA: Insufficient documentation

## 2011-12-29 DIAGNOSIS — Z79899 Other long term (current) drug therapy: Secondary | ICD-10-CM | POA: Insufficient documentation

## 2011-12-29 DIAGNOSIS — F17200 Nicotine dependence, unspecified, uncomplicated: Secondary | ICD-10-CM | POA: Insufficient documentation

## 2011-12-29 DIAGNOSIS — Z3202 Encounter for pregnancy test, result negative: Secondary | ICD-10-CM | POA: Insufficient documentation

## 2011-12-29 DIAGNOSIS — R42 Dizziness and giddiness: Secondary | ICD-10-CM | POA: Insufficient documentation

## 2011-12-29 DIAGNOSIS — F410 Panic disorder [episodic paroxysmal anxiety] without agoraphobia: Secondary | ICD-10-CM | POA: Insufficient documentation

## 2011-12-29 LAB — URINALYSIS, ROUTINE W REFLEX MICROSCOPIC
Bilirubin Urine: NEGATIVE
Hgb urine dipstick: NEGATIVE
Protein, ur: NEGATIVE mg/dL
Urobilinogen, UA: 1 mg/dL (ref 0.0–1.0)

## 2011-12-29 LAB — PREGNANCY, URINE: Preg Test, Ur: NEGATIVE

## 2011-12-29 LAB — URINE MICROSCOPIC-ADD ON

## 2011-12-29 MED ORDER — ONDANSETRON 8 MG PO TBDP
8.0000 mg | ORAL_TABLET | Freq: Once | ORAL | Status: AC
  Administered 2011-12-29: 8 mg via ORAL
  Filled 2011-12-29: qty 1

## 2011-12-29 NOTE — ED Provider Notes (Signed)
History     CSN: [id number]  Arrival date & time 12/29/11  1434   First MD Initiated Contact with Patient 12/29/11 1503      Chief Complaint  Patient presents with  . Nausea     Patient is a 36 y.o. female presenting with weakness. The history is provided by the patient.  Weakness The primary symptoms include headaches, dizziness and nausea. Primary symptoms do not include syncope, loss of consciousness, seizures, focal weakness, loss of sensation, fever or vomiting. Episode onset: today. Episode duration: all day. The symptoms are unchanged. The neurological symptoms are diffuse.  The headache is associated with weakness. The headache is not associated with photophobia or loss of balance.  Dizziness also occurs with nausea and weakness. Dizziness does not occur with vomiting.  Additional symptoms include weakness. Additional symptoms do not include lower back pain, loss of balance, photophobia, hearing loss or vertigo.  pt reports she has felt dizzy for "weeks" but no syncope or LOC.  Reports feeling faint, no vertigo She reports mild headache No visual loss No current cp/sob No abd pain No vomiting/diarrhea She reports nausea onset today No fever No dysuria No vag bleeding or discharge No rectal bleeding   Past Medical History  Diagnosis Date  . Asthma   . Anxiety   . Panic attacks     Past Surgical History  Procedure Date  . Oophorectomy     No family history on file.  History  Substance Use Topics  . Smoking status: Current Every Day Smoker -- 1.0 packs/day    Types: Cigarettes  . Smokeless tobacco: Not on file  . Alcohol Use: No    OB History    Grav Para Term Preterm Abortions TAB SAB Ect Mult Living                  Review of Systems  Constitutional: Negative for fever.  HENT: Negative for hearing loss.   Eyes: Negative for photophobia.  Respiratory: Negative for shortness of breath.   Cardiovascular: Negative for syncope.  Gastrointestinal:  Positive for nausea. Negative for vomiting.  Genitourinary: Negative for vaginal bleeding and vaginal discharge.  Skin: Negative for color change.  Neurological: Positive for dizziness, weakness and headaches. Negative for vertigo, focal weakness, seizures, loss of consciousness and loss of balance.  Psychiatric/Behavioral: Negative for agitation.  All other systems reviewed and are negative.    Allergies  Review of patient's allergies indicates no known allergies.  Home Medications   Current Outpatient Rx  Name  Route  Sig  Dispense  Refill  . ALBUTEROL SULFATE (2.5 MG/3ML) 0.083% IN NEBU   Nebulization   Take 2.5 mg by nebulization every 6 (six) hours as needed. For shortness of breath and wheezing         . FLUTICASONE PROPIONATE 50 MCG/ACT NA SUSP   Nasal   Place 2 sprays into the nose daily.           . IBUPROFEN 200 MG PO TABS   Oral   Take 800 mg by mouth every 6 (six) hours as needed. For pain          . LORATADINE 10 MG PO TABS   Oral   Take 10 mg by mouth daily.           Marland Kitchen LORAZEPAM 1 MG PO TABS   Oral   Take 1-2 tablets (1-2 mg total) by mouth at bedtime as needed (for sleep).   15 tablet  0   . PSEUDOEPHEDRINE HCL 60 MG PO TABS   Oral   Take 1 tablet (60 mg total) by mouth every 6 (six) hours as needed for congestion.   30 tablet   0   . TRAMADOL HCL 50 MG PO TABS   Oral   Take 1 tablet (50 mg total) by mouth every 6 (six) hours as needed for pain.   15 tablet   0     BP 111/69  Pulse 84  Temp 98.7 F (37.1 C) (Oral)  Resp 20  SpO2 99%  LMP 11/14/2011  Physical Exam CONSTITUTIONAL: Well developed/well nourished HEAD AND FACE: Normocephalic/atraumatic EYES: EOMI/PERRL, no nystagmus ENMT: Mucous membranes moist NECK: supple no meningeal signs SPINE:entire spine nontender CV: S1/S2 noted, no murmurs/rubs/gallops noted LUNGS: Lungs are clear to auscultation bilaterally, no apparent distress ABDOMEN: soft, nontender, no rebound or  guarding GU:no cva tenderness NEURO:Awake/alert, facies symmetric, no arm or leg drift is noted Cranial nerves 3/4/5/6/08/01/08/11/12 tested and intact Gait normal No past pointing EXTREMITIES: pulses normal, full ROM SKIN: warm, color normal PSYCH: no abnormalities of mood noted    ED Course  Procedures   Labs Reviewed  URINALYSIS, ROUTINE W REFLEX MICROSCOPIC - Abnormal; Notable for the following:    APPearance CLOUDY (*)     Leukocytes, UA TRACE (*)     All other components within normal limits  URINE MICROSCOPIC-ADD ON - Abnormal; Notable for the following:    Squamous Epithelial / LPF MANY (*)     Bacteria, UA MANY (*)     All other components within normal limits  PREGNANCY, URINE  URINE CULTURE  3:26 PM Pt well appearing without focal neuro deficits. She is nontoxic.  Advised f/u with PCP for her continued dizziness.  Ekg/labs unremarkable.  Stable for d/c   MDM  Nursing notes including past medical history and social history reviewed and considered in documentation Labs/vital reviewed and considered        Date: 12/29/2011  Rate: 76  Rhythm: normal sinus rhythm  QRS Axis: normal  Intervals: normal  ST/T Wave abnormalities: normal  Conduction Disutrbances:none  Narrative Interpretation:   Old EKG Reviewed: none available at time of interpretation    Joya Gaskins, MD 12/29/11 1527

## 2011-12-29 NOTE — ED Notes (Signed)
Nausea this am. States she feels dizzy.

## 2011-12-30 LAB — URINE CULTURE

## 2012-01-14 ENCOUNTER — Emergency Department (HOSPITAL_BASED_OUTPATIENT_CLINIC_OR_DEPARTMENT_OTHER)
Admission: EM | Admit: 2012-01-14 | Discharge: 2012-01-14 | Disposition: A | Discharge status: Home / Self Care | Payer: 59 | Attending: Emergency Medicine | Admitting: Emergency Medicine

## 2012-01-14 ENCOUNTER — Encounter (HOSPITAL_BASED_OUTPATIENT_CLINIC_OR_DEPARTMENT_OTHER): Payer: Self-pay | Admitting: *Deleted

## 2012-01-14 DIAGNOSIS — N760 Acute vaginitis: Secondary | ICD-10-CM | POA: Insufficient documentation

## 2012-01-14 DIAGNOSIS — F17200 Nicotine dependence, unspecified, uncomplicated: Secondary | ICD-10-CM | POA: Insufficient documentation

## 2012-01-14 DIAGNOSIS — Z8659 Personal history of other mental and behavioral disorders: Secondary | ICD-10-CM | POA: Insufficient documentation

## 2012-01-14 DIAGNOSIS — B9689 Other specified bacterial agents as the cause of diseases classified elsewhere: Secondary | ICD-10-CM

## 2012-01-14 DIAGNOSIS — Z79899 Other long term (current) drug therapy: Secondary | ICD-10-CM | POA: Insufficient documentation

## 2012-01-14 DIAGNOSIS — J45909 Unspecified asthma, uncomplicated: Secondary | ICD-10-CM | POA: Insufficient documentation

## 2012-01-14 LAB — WET PREP, GENITAL: Trich, Wet Prep: NONE SEEN

## 2012-01-14 LAB — URINALYSIS, ROUTINE W REFLEX MICROSCOPIC
Bilirubin Urine: NEGATIVE
Glucose, UA: NEGATIVE mg/dL
Ketones, ur: NEGATIVE mg/dL
pH: 5.5 (ref 5.0–8.0)

## 2012-01-14 LAB — URINE MICROSCOPIC-ADD ON

## 2012-01-14 MED ORDER — METRONIDAZOLE 500 MG PO TABS: 500.0000 mg | ORAL_TABLET | Freq: Two times a day (BID) | ORAL | Status: AC

## 2012-01-14 NOTE — ED Notes (Addendum)
Pt states her last "normal" period was 2 weeks ago. Has been spotting and cramping since. ?pregnant. Using tampons

## 2012-01-14 NOTE — ED Provider Notes (Signed)
History     CSN: [id number]  Arrival date & time 01/14/12  2000   First MD Initiated Contact with Patient 01/14/12 2111      Chief Complaint  Patient presents with  . Vaginal Bleeding    (Consider location/radiation/quality/duration/timing/severity/associated sxs/prior treatment) Patient is a 36 y.o. female presenting with vaginal bleeding. The history is provided by the patient. No language interpreter was used.  Vaginal Bleeding This is a new problem. The current episode started today. The problem occurs constantly. The problem has been gradually worsening. Pertinent negatives include no abdominal pain or fatigue. Nothing aggravates the symptoms. She has tried nothing for the symptoms. The treatment provided no relief.  Pt reports she had had spotting since her last period.  Pt reports small amount of spotting.  Pt reports vaginal odor  Past Medical History  Diagnosis Date  . Asthma   . Anxiety   . Panic attacks     Past Surgical History  Procedure Date  . Oophorectomy     History reviewed. No pertinent family history.  History  Substance Use Topics  . Smoking status: Current Every Day Smoker -- 1.0 packs/day    Types: Cigarettes  . Smokeless tobacco: Not on file  . Alcohol Use: No    OB History    Grav Para Term Preterm Abortions TAB SAB Ect Mult Living                  Review of Systems  Constitutional: Negative for fatigue.  Gastrointestinal: Negative for abdominal pain.  Genitourinary: Positive for vaginal bleeding and vaginal discharge.  All other systems reviewed and are negative.    Allergies  Review of patient's allergies indicates no known allergies.  Home Medications   Current Outpatient Rx  Name  Route  Sig  Dispense  Refill  . ALBUTEROL SULFATE (2.5 MG/3ML) 0.083% IN NEBU   Nebulization   Take 2.5 mg by nebulization every 6 (six) hours as needed. For shortness of breath and wheezing         . FLUTICASONE PROPIONATE 50 MCG/ACT NA  SUSP   Nasal   Place 2 sprays into the nose daily.           . IBUPROFEN 200 MG PO TABS   Oral   Take 800 mg by mouth every 6 (six) hours as needed. For pain          . LORATADINE 10 MG PO TABS   Oral   Take 10 mg by mouth daily.           Marland Kitchen LORAZEPAM 1 MG PO TABS   Oral   Take 1-2 tablets (1-2 mg total) by mouth at bedtime as needed (for sleep).   15 tablet   0   . PSEUDOEPHEDRINE HCL 60 MG PO TABS   Oral   Take 1 tablet (60 mg total) by mouth every 6 (six) hours as needed for congestion.   30 tablet   0   . TRAMADOL HCL 50 MG PO TABS   Oral   Take 1 tablet (50 mg total) by mouth every 6 (six) hours as needed for pain.   15 tablet   0     BP 119/63  Pulse 89  Temp 98.9 F (37.2 C) (Oral)  Resp 20  Ht 5\' 4"  (1.626 m)  Wt 150 lb (68.04 kg)  BMI 25.75 kg/m2  SpO2 99%  LMP 12/31/2011  Physical Exam  Nursing note and vitals reviewed. Constitutional: She is  oriented to person, place, and time. She appears well-developed and well-nourished.  HENT:  Head: Normocephalic and atraumatic.  Eyes: Pupils are equal, round, and reactive to light.  Neck: Normal range of motion.  Cardiovascular: Normal rate.   Pulmonary/Chest: Effort normal.  Abdominal: Soft.  Genitourinary: Vaginal discharge found.       Scant blood/discharge,  Adnexa nontender,  Uterus nontender  Musculoskeletal: Normal range of motion.  Neurological: She is alert and oriented to person, place, and time. She has normal reflexes.  Skin: Skin is warm.  Psychiatric: She has a normal mood and affect.    ED Course  Procedures (including critical care time)  Labs Reviewed  URINALYSIS, ROUTINE W REFLEX MICROSCOPIC - Abnormal; Notable for the following:    APPearance CLOUDY (*)     Hgb urine dipstick MODERATE (*)     All other components within normal limits  URINE MICROSCOPIC-ADD ON - Abnormal; Notable for the following:    Squamous Epithelial / LPF MANY (*)     Bacteria, UA MANY (*)     All  other components within normal limits  PREGNANCY, URINE   No results found.   No diagnosis found.    MDM  rx for flagyl        Elson Areas, [place] 01/14/12 2255

## 2012-01-14 NOTE — ED Notes (Signed)
Pt has left without notifying RN.  Room is empty.  Went out to try to find pt and give papers without success.  Will leave papers with secretary

## 2012-01-15 NOTE — ED Provider Notes (Signed)
Medical screening examination/treatment/procedure(s) were performed by non-physician practitioner and as supervising physician I was immediately available for consultation/collaboration.  Sirena Riddle, MD 01/15/12 0656 

## 2012-01-19 NOTE — ED Notes (Signed)
Pt called to state she continues to have lower abdominal cramping and heavier vaginal bleeding.  Chart reviewed with Dr. Bernette Mayers, states patient will need to go to [facility] if she continues to have gyn problems.  Info, phone # and directions given to the patient, verbalized understanding.

## 2012-05-11 ENCOUNTER — Encounter (HOSPITAL_BASED_OUTPATIENT_CLINIC_OR_DEPARTMENT_OTHER): Payer: Self-pay

## 2012-05-11 ENCOUNTER — Emergency Department (HOSPITAL_BASED_OUTPATIENT_CLINIC_OR_DEPARTMENT_OTHER)
Admission: EM | Admit: 2012-05-11 | Discharge: 2012-05-11 | Disposition: A | Discharge status: Home / Self Care | Payer: 59 | Attending: Emergency Medicine | Admitting: Emergency Medicine

## 2012-05-11 DIAGNOSIS — J45909 Unspecified asthma, uncomplicated: Secondary | ICD-10-CM | POA: Insufficient documentation

## 2012-05-11 DIAGNOSIS — R51 Headache: Secondary | ICD-10-CM | POA: Insufficient documentation

## 2012-05-11 DIAGNOSIS — F17200 Nicotine dependence, unspecified, uncomplicated: Secondary | ICD-10-CM | POA: Insufficient documentation

## 2012-05-11 DIAGNOSIS — J3489 Other specified disorders of nose and nasal sinuses: Secondary | ICD-10-CM | POA: Insufficient documentation

## 2012-05-11 DIAGNOSIS — Z79899 Other long term (current) drug therapy: Secondary | ICD-10-CM | POA: Insufficient documentation

## 2012-05-11 DIAGNOSIS — R05 Cough: Secondary | ICD-10-CM | POA: Insufficient documentation

## 2012-05-11 DIAGNOSIS — J329 Chronic sinusitis, unspecified: Secondary | ICD-10-CM

## 2012-05-11 DIAGNOSIS — R059 Cough, unspecified: Secondary | ICD-10-CM | POA: Insufficient documentation

## 2012-05-11 DIAGNOSIS — F410 Panic disorder [episodic paroxysmal anxiety] without agoraphobia: Secondary | ICD-10-CM | POA: Insufficient documentation

## 2012-05-11 MED ORDER — PREDNISONE 10 MG PO TABS: ORAL_TABLET | ORAL | Status: DC

## 2012-05-11 MED ORDER — AMOXICILLIN 500 MG PO CAPS: 500.0000 mg | ORAL_CAPSULE | Freq: Three times a day (TID) | ORAL | Status: AC

## 2012-05-11 NOTE — ED Notes (Signed)
Pt states that she has had uri symptoms x1 week.  Pt states that she contacted pcp but they were unable to help her as her MD was out on medical leave.

## 2012-05-11 NOTE — ED Provider Notes (Signed)
History     CSN: [id number]  Arrival date & time 05/11/12  1645   First MD Initiated Contact with Patient 05/11/12 1819      Chief Complaint  Patient presents with  . URI    (Consider location/radiation/quality/duration/timing/severity/associated sxs/prior treatment) Patient is a 37 y.o. female presenting with URI. The history is provided by the patient. No language interpreter was used.  URI Presenting symptoms: congestion and cough   Severity:  Moderate Onset quality:  Gradual Duration:  1 week Timing:  Constant Progression:  Worsening Chronicity:  New Relieved by:  Nothing Ineffective treatments:  None tried Associated symptoms: headaches   Risk factors: not elderly, no chronic cardiac disease, no recent illness and no recent travel     Past Medical History  Diagnosis Date  . Asthma   . Anxiety   . Panic attacks     Past Surgical History  Procedure Laterality Date  . Oophorectomy      History reviewed. No pertinent family history.  History  Substance Use Topics  . Smoking status: Current Every Day Smoker -- 1.00 packs/day for 20 years    Types: Cigarettes  . Smokeless tobacco: Never Used  . Alcohol Use: No    OB History   Grav Para Term Preterm Abortions TAB SAB Ect Mult Living                  Review of Systems  HENT: Positive for congestion.   Respiratory: Positive for cough.   Neurological: Positive for headaches.  All other systems reviewed and are negative.    Allergies  Review of patient's allergies indicates no known allergies.  Home Medications   Current Outpatient Rx  Name  Route  Sig  Dispense  Refill  . albuterol (PROVENTIL) (2.5 MG/3ML) 0.083% nebulizer solution   Nebulization   Take 2.5 mg by nebulization every 6 (six) hours as needed. For shortness of breath and wheezing         . fluticasone (FLONASE) 50 MCG/ACT nasal spray   Nasal   Place 2 sprays into the nose daily.           Marland Kitchen ibuprofen (ADVIL,MOTRIN) 200 MG  tablet   Oral   Take 800 mg by mouth every 6 (six) hours as needed. For pain          . loratadine (CLARITIN) 10 MG tablet   Oral   Take 10 mg by mouth daily.           . pseudoephedrine (SUDAFED) 60 MG tablet   Oral   Take 1 tablet (60 mg total) by mouth every 6 (six) hours as needed for congestion.   30 tablet   0   . amoxicillin (AMOXIL) 500 MG capsule   Oral   Take 1 capsule (500 mg total) by mouth 3 (three) times daily.   30 capsule   0   . LORazepam (ATIVAN) 1 MG tablet   Oral   Take 1-2 tablets (1-2 mg total) by mouth at bedtime as needed (for sleep).   15 tablet   0   . metroNIDAZOLE (FLAGYL) 500 MG tablet   Oral   Take 1 tablet (500 mg total) by mouth 2 (two) times daily.   14 tablet   0   . predniSONE (DELTASONE) 10 MG tablet      6,5,4,3,2,1 taper   21 tablet   0   . traMADol (ULTRAM) 50 MG tablet   Oral   Take 1 tablet (  50 mg total) by mouth every 6 (six) hours as needed for pain.   15 tablet   0     BP 116/80  Pulse 78  Temp(Src) 98.3 F (36.8 C) (Oral)  Resp 18  Ht 5\' 4"  (1.626 m)  Wt 155 lb (70.308 kg)  BMI 26.59 kg/m2  SpO2 100%  LMP 04/20/2012  Physical Exam  Nursing note and vitals reviewed. Constitutional: She is oriented to person, place, and time. She appears well-developed and well-nourished.  HENT:  Head: Normocephalic.  Right Ear: External ear normal.  Left Ear: External ear normal.  Nose: Nose normal.  Mouth/Throat: Oropharynx is clear and moist.  Tender maxillary sinuses  Eyes: Conjunctivae are normal. Pupils are equal, round, and reactive to light.  Neck: Normal range of motion. Neck supple.  Cardiovascular: Normal rate, regular rhythm and normal heart sounds.   Pulmonary/Chest: Effort normal.  Abdominal: Soft.  Musculoskeletal: Normal range of motion.  Neurological: She is alert and oriented to person, place, and time. She has normal reflexes.  Skin: Skin is warm.  Psychiatric: She has a normal mood and affect.     ED Course  Procedures (including critical care time)  Labs Reviewed - No data to display No results found.   1. Sinusitis       MDM  Pt given rx for amoxicillian,  I suspect some allergic symtpms.   Pt also treated with prednisone.  Pt advised to see her Md for recheck next week if symptoms persist.        Elson Areas, PA-C 05/11/12 2120

## 2012-05-11 NOTE — ED Notes (Signed)
Pa  at bedside. 

## 2012-05-12 NOTE — ED Provider Notes (Signed)
Medical screening examination/treatment/procedure(s) were performed by non-physician practitioner and as supervising physician I was immediately available for consultation/collaboration.   Audley Hinojos III, MD 05/12/12 0035 

## 2012-08-11 ENCOUNTER — Encounter (HOSPITAL_BASED_OUTPATIENT_CLINIC_OR_DEPARTMENT_OTHER): Payer: Self-pay | Admitting: *Deleted

## 2012-08-11 ENCOUNTER — Emergency Department (HOSPITAL_BASED_OUTPATIENT_CLINIC_OR_DEPARTMENT_OTHER)
Admission: EM | Admit: 2012-08-11 | Discharge: 2012-08-12 | Disposition: A | Discharge status: Home / Self Care | Payer: 59 | Attending: Emergency Medicine | Admitting: Emergency Medicine

## 2012-08-11 DIAGNOSIS — N939 Abnormal uterine and vaginal bleeding, unspecified: Secondary | ICD-10-CM

## 2012-08-11 DIAGNOSIS — F17200 Nicotine dependence, unspecified, uncomplicated: Secondary | ICD-10-CM | POA: Insufficient documentation

## 2012-08-11 DIAGNOSIS — R109 Unspecified abdominal pain: Secondary | ICD-10-CM | POA: Insufficient documentation

## 2012-08-11 DIAGNOSIS — K5900 Constipation, unspecified: Secondary | ICD-10-CM | POA: Insufficient documentation

## 2012-08-11 DIAGNOSIS — F410 Panic disorder [episodic paroxysmal anxiety] without agoraphobia: Secondary | ICD-10-CM | POA: Insufficient documentation

## 2012-08-11 DIAGNOSIS — Z9079 Acquired absence of other genital organ(s): Secondary | ICD-10-CM | POA: Insufficient documentation

## 2012-08-11 DIAGNOSIS — Z79899 Other long term (current) drug therapy: Secondary | ICD-10-CM | POA: Insufficient documentation

## 2012-08-11 DIAGNOSIS — N898 Other specified noninflammatory disorders of vagina: Secondary | ICD-10-CM | POA: Insufficient documentation

## 2012-08-11 DIAGNOSIS — R197 Diarrhea, unspecified: Secondary | ICD-10-CM | POA: Insufficient documentation

## 2012-08-11 DIAGNOSIS — Z3202 Encounter for pregnancy test, result negative: Secondary | ICD-10-CM | POA: Insufficient documentation

## 2012-08-11 DIAGNOSIS — J45909 Unspecified asthma, uncomplicated: Secondary | ICD-10-CM | POA: Insufficient documentation

## 2012-08-11 LAB — WET PREP, GENITAL
Clue Cells Wet Prep HPF POC: NONE SEEN
Trich, Wet Prep: NONE SEEN

## 2012-08-11 LAB — URINALYSIS, ROUTINE W REFLEX MICROSCOPIC
Ketones, ur: 15 mg/dL — AB
Leukocytes, UA: NEGATIVE
Nitrite: NEGATIVE
Urobilinogen, UA: 1 mg/dL (ref 0.0–1.0)

## 2012-08-11 MED ORDER — POLYETHYLENE GLYCOL 3350 17 GM/SCOOP PO POWD: 17.0000 g | Freq: Every day | ORAL | Status: AC

## 2012-08-11 NOTE — ED Notes (Signed)
Constipation x  2 weeks- also c/o of vaginal bleeding and abd cramping

## 2012-08-11 NOTE — ED Provider Notes (Signed)
History    CSN: [id number] Arrival date & time 08/11/12  2220  First MD Initiated Contact with Patient 08/11/12 2244     Chief Complaint  Patient presents with  . Constipation  . Abdominal Pain   (Consider location/radiation/quality/duration/timing/severity/associated sxs/prior Treatment) HPI Comments: Patient presents with complaint of vaginal bleeding and constipation. Patient has had constipation for 2 weeks. She states that she has been unable to have a bowel movement. She did have a small amount of watery stool yesterday. She denies use of medications that would make her constipated. She has tried eating fruit and ex-lax which has not helped. Patient also reports 2 days of vaginal bleeding with passage of clots and lower abdomen cramping. Patient states that her last normal menstrual period was 2 weeks ago and was normal for her. She denies fever, vomiting, urinary symptoms. No family history of colon cancer. Onset of symptoms was gradual. Course is waxing and waning. Nothing makes symptoms better or worse.  Patient is a 37 y.o. female presenting with constipation and abdominal pain. The history is provided by the patient.  Constipation Associated symptoms: abdominal pain   Associated symptoms: no diarrhea, no dysuria, no fever, no nausea and no vomiting   Abdominal Pain Associated symptoms include abdominal pain. Pertinent negatives include no chest pain, coughing, fever, headaches, myalgias, nausea, rash, sore throat or vomiting.   Past Medical History  Diagnosis Date  . Asthma   . Anxiety   . Panic attacks    Past Surgical History  Procedure Laterality Date  . Oophorectomy     No family history on file. History  Substance Use Topics  . Smoking status: Current Every Day Smoker -- 1.00 packs/day for 20 years    Types: Cigarettes  . Smokeless tobacco: Never Used  . Alcohol Use: No   OB History   Grav Para Term Preterm Abortions TAB SAB Ect Mult Living                  Review of Systems  Constitutional: Negative for fever.  HENT: Negative for sore throat and rhinorrhea.   Eyes: Negative for redness.  Respiratory: Negative for cough.   Cardiovascular: Negative for chest pain.  Gastrointestinal: Positive for abdominal pain and constipation. Negative for nausea, vomiting and diarrhea.  Genitourinary: Positive for vaginal bleeding. Negative for dysuria, frequency, hematuria and flank pain.  Musculoskeletal: Negative for myalgias.  Skin: Negative for rash.  Neurological: Negative for headaches.    Allergies  Review of patient's allergies indicates no known allergies.  Home Medications   Current Outpatient Rx  Name  Route  Sig  Dispense  Refill  . albuterol (PROVENTIL) (2.5 MG/3ML) 0.083% nebulizer solution   Nebulization   Take 2.5 mg by nebulization every 6 (six) hours as needed. For shortness of breath and wheezing         . fluticasone (FLONASE) 50 MCG/ACT nasal spray   Nasal   Place 2 sprays into the nose daily.           Marland Kitchen ibuprofen (ADVIL,MOTRIN) 200 MG tablet   Oral   Take 800 mg by mouth every 6 (six) hours as needed. For pain          . loratadine (CLARITIN) 10 MG tablet   Oral   Take 10 mg by mouth daily.           Marland Kitchen LORazepam (ATIVAN) 1 MG tablet   Oral   Take 1-2 tablets (1-2 mg total) by mouth at  bedtime as needed (for sleep).   15 tablet   0   . amoxicillin (AMOXIL) 500 MG capsule   Oral   Take 1 capsule (500 mg total) by mouth 3 (three) times daily.   30 capsule   0   . metroNIDAZOLE (FLAGYL) 500 MG tablet   Oral   Take 1 tablet (500 mg total) by mouth 2 (two) times daily.   14 tablet   0   . predniSONE (DELTASONE) 10 MG tablet      6,5,4,3,2,1 taper   21 tablet   0   . pseudoephedrine (SUDAFED) 60 MG tablet   Oral   Take 1 tablet (60 mg total) by mouth every 6 (six) hours as needed for congestion.   30 tablet   0   . traMADol (ULTRAM) 50 MG tablet   Oral   Take 1 tablet (50 mg total) by  mouth every 6 (six) hours as needed for pain.   15 tablet   0    BP 114/60  Pulse 85  Temp(Src) 98.1 F (36.7 C) (Oral)  Resp 18  Ht 5\' 4"  (1.626 m)  Wt 150 lb (68.04 kg)  BMI 25.73 kg/m2  SpO2 99%  LMP 07/28/2012  Physical Exam  Nursing note and vitals reviewed. Constitutional: She appears well-developed and well-nourished.  HENT:  Head: Normocephalic and atraumatic.  Eyes: Conjunctivae are normal. Right eye exhibits no discharge. Left eye exhibits no discharge.  Neck: Normal range of motion. Neck supple.  Cardiovascular: Normal rate, regular rhythm and normal heart sounds.   Pulmonary/Chest: Effort normal and breath sounds normal.  Abdominal: Soft. Bowel sounds are normal. She exhibits no distension. There is tenderness (mild, patient does not react with palpation). There is no rebound and no guarding.  Genitourinary: Uterus is not tender. Cervix exhibits discharge (blood, mild). Cervix exhibits no motion tenderness and no friability. Right adnexum displays no tenderness. Left adnexum displays no tenderness. There is bleeding (mild) around the vagina. No erythema or tenderness around the vagina.  Neurological: She is alert.  Skin: Skin is warm and dry.  Psychiatric: She has a normal mood and affect.    ED Course  Procedures (including critical care time) Labs Reviewed  WET PREP, GENITAL - Abnormal; Notable for the following:    WBC, Wet Prep HPF POC FEW (*)    All other components within normal limits  URINALYSIS, ROUTINE W REFLEX MICROSCOPIC - Abnormal; Notable for the following:    APPearance CLOUDY (*)    Specific Gravity, Urine 1.031 (*)    Hgb urine dipstick LARGE (*)    Bilirubin Urine SMALL (*)    Ketones, ur 15 (*)    All other components within normal limits  URINE MICROSCOPIC-ADD ON - Abnormal; Notable for the following:    Squamous Epithelial / LPF FEW (*)    Bacteria, UA MANY (*)    All other components within normal limits  GC/CHLAMYDIA PROBE AMP   No  results found. 1. Vaginal bleeding   2. Constipation     11:35 PM Patient seen and examined. Work-up initiated. Medications ordered.   Vital signs reviewed and are as follows: Filed Vitals:   08/11/12 2230  BP: 114/60  Pulse: 85  Temp: 98.1 F (36.7 C)  Resp: 18   Pelvic performed with nurse chaperone.   Patient informed of findings.   For vaginal bleeding: WH follow-up. Return with worsening bleeding.   Constipation: Miralax bid, OTC enema/suppository as desired.   MDM  Patient  appears well, non-toxic.  Vaginal bleeding: She is not pregnant, she has benign pelvic exam with only small amount of blood. No large amount of bleeding to cause anemia suspected. Pt appears stable. No tenderness on pelvic exam. Do not suspect PID or sexually transmitted disease. GC/Chlamydia swab is pending.  Constipation: The patient does not have any obstructive symptoms. She has only tried fiber and ex-lax. Patient to use MiraLax, hydrate well, increase fiber, and use any over-the-counter enemas or suppositories as directed on packaging. Do not feel imaging is indicated at this time. Patient does not have any abdominal distention or tenderness.   Renne Crigler, PA-C 08/12/12 1347

## 2012-08-12 NOTE — ED Provider Notes (Signed)
Medical screening examination/treatment/procedure(s) were performed by non-physician practitioner and as supervising physician I was immediately available for consultation/collaboration.  Jasmine Awe, MD 08/12/12 [phone]

## 2012-08-13 LAB — GC/CHLAMYDIA PROBE AMP
CT Probe RNA: NEGATIVE
GC Probe RNA: NEGATIVE

## 2012-11-27 ENCOUNTER — Encounter (HOSPITAL_BASED_OUTPATIENT_CLINIC_OR_DEPARTMENT_OTHER): Payer: Self-pay | Admitting: Emergency Medicine

## 2012-11-27 ENCOUNTER — Emergency Department (HOSPITAL_BASED_OUTPATIENT_CLINIC_OR_DEPARTMENT_OTHER): Payer: 59

## 2012-11-27 ENCOUNTER — Emergency Department (HOSPITAL_BASED_OUTPATIENT_CLINIC_OR_DEPARTMENT_OTHER)
Admission: EM | Admit: 2012-11-27 | Discharge: 2012-11-27 | Disposition: A | Discharge status: Home / Self Care | Payer: 59 | Attending: Emergency Medicine | Admitting: Emergency Medicine

## 2012-11-27 DIAGNOSIS — J45901 Unspecified asthma with (acute) exacerbation: Secondary | ICD-10-CM | POA: Insufficient documentation

## 2012-11-27 DIAGNOSIS — J069 Acute upper respiratory infection, unspecified: Secondary | ICD-10-CM | POA: Insufficient documentation

## 2012-11-27 DIAGNOSIS — Z8719 Personal history of other diseases of the digestive system: Secondary | ICD-10-CM | POA: Insufficient documentation

## 2012-11-27 DIAGNOSIS — Z792 Long term (current) use of antibiotics: Secondary | ICD-10-CM | POA: Insufficient documentation

## 2012-11-27 DIAGNOSIS — F410 Panic disorder [episodic paroxysmal anxiety] without agoraphobia: Secondary | ICD-10-CM | POA: Insufficient documentation

## 2012-11-27 DIAGNOSIS — F17200 Nicotine dependence, unspecified, uncomplicated: Secondary | ICD-10-CM | POA: Insufficient documentation

## 2012-11-27 DIAGNOSIS — IMO0002 Reserved for concepts with insufficient information to code with codable children: Secondary | ICD-10-CM | POA: Insufficient documentation

## 2012-11-27 DIAGNOSIS — Z79899 Other long term (current) drug therapy: Secondary | ICD-10-CM | POA: Insufficient documentation

## 2012-11-27 HISTORY — DX: Diverticulosis of intestine, part unspecified, without perforation or abscess without bleeding: K57.90

## 2012-11-27 MED ORDER — IPRATROPIUM BROMIDE 0.02 % IN SOLN
0.5000 mg | Freq: Once | RESPIRATORY_TRACT | Status: AC
  Administered 2012-11-27: 0.5 mg via RESPIRATORY_TRACT
  Filled 2012-11-27: qty 2.5

## 2012-11-27 MED ORDER — HYDROCOD POLST-CHLORPHEN POLST 10-8 MG/5ML PO LQCR: 5.0000 mL | Freq: Two times a day (BID) | ORAL | Status: AC | PRN

## 2012-11-27 MED ORDER — ALBUTEROL SULFATE (5 MG/ML) 0.5% IN NEBU
5.0000 mg | INHALATION_SOLUTION | Freq: Once | RESPIRATORY_TRACT | Status: AC
  Administered 2012-11-27: 5 mg via RESPIRATORY_TRACT
  Filled 2012-11-27: qty 1

## 2012-11-27 NOTE — ED Provider Notes (Signed)
Medical screening examination/treatment/procedure(s) were performed by non-physician practitioner and as supervising physician I was immediately available for consultation/collaboration.  EKG Interpretation   None        Geoffery Lyons, MD 11/27/12 2039

## 2012-11-27 NOTE — ED Notes (Signed)
Patient states she has a two day history of sinus congestion and congested cough. Intermittently productive with clear secretions. States she has a low grade fever.

## 2012-11-27 NOTE — ED Provider Notes (Signed)
CSN: [id number]     Arrival date & time 11/27/12  1815 History   First MD Initiated Contact with Patient 11/27/12 1824     Chief Complaint  Patient presents with  . URI   (Consider location/radiation/quality/duration/timing/severity/associated sxs/prior Treatment) Patient is a 37 y.o. female presenting with URI. The history is provided by the patient.  URI Presenting symptoms: congestion, cough and fever   Severity:  Mild Onset quality:  Gradual Timing:  Constant Chronicity:  New Relieved by:  Nothing Ineffective treatments:  Inhaler Associated symptoms: no headaches     Past Medical History  Diagnosis Date  . Asthma   . Anxiety   . Panic attacks   . Diverticulosis    Past Surgical History  Procedure Laterality Date  . Oophorectomy     No family history on file. History  Substance Use Topics  . Smoking status: Current Every Day Smoker -- 1.00 packs/day for 20 years    Types: Cigarettes  . Smokeless tobacco: Never Used  . Alcohol Use: No   OB History   Grav Para Term Preterm Abortions TAB SAB Ect Mult Living                 Review of Systems  Constitutional: Positive for fever.  HENT: Positive for congestion.   Respiratory: Positive for cough.   Cardiovascular: Negative.   Neurological: Negative for headaches.    Allergies  Review of patient's allergies indicates no known allergies.  Home Medications   Current Outpatient Rx  Name  Route  Sig  Dispense  Refill  . acetaminophen (TYLENOL) 325 MG tablet   Oral   Take 650 mg by mouth every 6 (six) hours as needed.         Marland Kitchen albuterol (PROVENTIL) (2.5 MG/3ML) 0.083% nebulizer solution   Nebulization   Take 2.5 mg by nebulization every 6 (six) hours as needed. For shortness of breath and wheezing         . amoxicillin (AMOXIL) 500 MG capsule   Oral   Take 1 capsule (500 mg total) by mouth 3 (three) times daily.   30 capsule   0   . fluticasone (FLONASE) 50 MCG/ACT nasal spray   Nasal   Place 2  sprays into the nose daily.           Marland Kitchen ibuprofen (ADVIL,MOTRIN) 200 MG tablet   Oral   Take 800 mg by mouth every 6 (six) hours as needed. For pain          . loratadine (CLARITIN) 10 MG tablet   Oral   Take 10 mg by mouth daily.           Marland Kitchen LORazepam (ATIVAN) 1 MG tablet   Oral   Take 1-2 tablets (1-2 mg total) by mouth at bedtime as needed (for sleep).   15 tablet   0   . metroNIDAZOLE (FLAGYL) 500 MG tablet   Oral   Take 1 tablet (500 mg total) by mouth 2 (two) times daily.   14 tablet   0   . polyethylene glycol powder (GLYCOLAX/MIRALAX) powder   Oral   Take 17 g by mouth daily.   255 g   0   . predniSONE (DELTASONE) 10 MG tablet      6,5,4,3,2,1 taper   21 tablet   0   . pseudoephedrine (SUDAFED) 60 MG tablet   Oral   Take 1 tablet (60 mg total) by mouth every 6 (six) hours as needed for  congestion.   30 tablet   0   . traMADol (ULTRAM) 50 MG tablet   Oral   Take 1 tablet (50 mg total) by mouth every 6 (six) hours as needed for pain.   15 tablet   0    BP 118/78  Pulse 97  Temp(Src) 98 F (36.7 C) (Oral)  Resp 20  SpO2 98%  LMP 11/13/2012 Physical Exam  Nursing note and vitals reviewed. Constitutional: She appears well-developed and well-nourished.  HENT:  Right Ear: External ear normal.  Left Ear: External ear normal.  pharyngeal erythema  Cardiovascular: Normal rate and regular rhythm.   Pulmonary/Chest: Effort normal. She has wheezes.  Musculoskeletal: Normal range of motion.  Skin: Skin is warm and dry.    ED Course  Procedures (including critical care time) Labs Review Labs Reviewed - No data to display Imaging Review Dg Chest 2 View  11/27/2012   CLINICAL DATA:  cough and congestion  EXAM: CHEST  2 VIEW  COMPARISON:  December 10, 2009  FINDINGS: The lungs are clear. Heart size and pulmonary vascularity are normal. No adenopathy. No pneumothorax. No bone lesions.  IMPRESSION: No edema or consolidation.   Electronically Signed    By: Bretta Bang M.D.   On: 11/27/2012 19:03    EKG Interpretation   None       MDM   1. URI (upper respiratory infection)    No pneumonia noted:will treat symptomatically:pt no longer wheezing:pt has inhaler at home:pt has pcp for follow up in [facility]   Teressa Lower, NP 11/27/12 1949

## 2013-01-22 ENCOUNTER — Encounter (HOSPITAL_BASED_OUTPATIENT_CLINIC_OR_DEPARTMENT_OTHER): Payer: Self-pay | Admitting: Emergency Medicine

## 2013-01-22 ENCOUNTER — Emergency Department (HOSPITAL_BASED_OUTPATIENT_CLINIC_OR_DEPARTMENT_OTHER)
Admission: EM | Admit: 2013-01-22 | Discharge: 2013-01-22 | Disposition: A | Discharge status: Home / Self Care | Payer: 59 | Attending: Emergency Medicine | Admitting: Emergency Medicine

## 2013-01-22 ENCOUNTER — Emergency Department (HOSPITAL_BASED_OUTPATIENT_CLINIC_OR_DEPARTMENT_OTHER): Payer: 59

## 2013-01-22 DIAGNOSIS — Z792 Long term (current) use of antibiotics: Secondary | ICD-10-CM | POA: Insufficient documentation

## 2013-01-22 DIAGNOSIS — Z79899 Other long term (current) drug therapy: Secondary | ICD-10-CM | POA: Insufficient documentation

## 2013-01-22 DIAGNOSIS — F17200 Nicotine dependence, unspecified, uncomplicated: Secondary | ICD-10-CM | POA: Insufficient documentation

## 2013-01-22 DIAGNOSIS — J069 Acute upper respiratory infection, unspecified: Secondary | ICD-10-CM | POA: Insufficient documentation

## 2013-01-22 DIAGNOSIS — IMO0001 Reserved for inherently not codable concepts without codable children: Secondary | ICD-10-CM | POA: Insufficient documentation

## 2013-01-22 DIAGNOSIS — J45909 Unspecified asthma, uncomplicated: Secondary | ICD-10-CM | POA: Insufficient documentation

## 2013-01-22 DIAGNOSIS — Z8719 Personal history of other diseases of the digestive system: Secondary | ICD-10-CM | POA: Insufficient documentation

## 2013-01-22 DIAGNOSIS — F410 Panic disorder [episodic paroxysmal anxiety] without agoraphobia: Secondary | ICD-10-CM | POA: Insufficient documentation

## 2013-01-22 MED ORDER — HYDROCODONE-ACETAMINOPHEN 7.5-325 MG/15ML PO SOLN: 10.0000 mL | Freq: Four times a day (QID) | ORAL | Status: AC | PRN

## 2013-01-22 MED ORDER — FLUTICASONE PROPIONATE 50 MCG/ACT NA SUSP: 2.0000 | Freq: Every day | NASAL | Status: AC

## 2013-01-22 MED ORDER — PREDNISONE 10 MG PO TABS: ORAL_TABLET | ORAL | Status: AC

## 2013-01-22 MED ORDER — ALBUTEROL SULFATE HFA 108 (90 BASE) MCG/ACT IN AERS: 2.0000 | INHALATION_SPRAY | Freq: Four times a day (QID) | RESPIRATORY_TRACT | Status: AC | PRN

## 2013-01-22 NOTE — ED Provider Notes (Signed)
CSN: [id number]     Arrival date & time 01/22/13  1453 History   First MD Initiated Contact with Patient 01/22/13 1512     Chief Complaint  Patient presents with  . Influenza   (Consider location/radiation/quality/duration/timing/severity/associated sxs/prior Treatment) Patient is a 37 y.o. female presenting with flu symptoms. The history is provided by the patient. No language interpreter was used.  Influenza Presenting symptoms: cough, fatigue, myalgias and sore throat   Presenting symptoms: no fever and no vomiting   Associated symptoms comment:  URI symptoms of cough, congestion, for the past week. She reports other family members are sick. She states that her symptoms prevent her from smoking like usual.   Past Medical History  Diagnosis Date  . Asthma   . Anxiety   . Panic attacks   . Diverticulosis    Past Surgical History  Procedure Laterality Date  . Oophorectomy     No family history on file. History  Substance Use Topics  . Smoking status: Current Every Day Smoker -- 1.00 packs/day for 20 years    Types: Cigarettes  . Smokeless tobacco: Never Used  . Alcohol Use: No   OB History   Grav Para Term Preterm Abortions TAB SAB Ect Mult Living                 Review of Systems  Constitutional: Positive for fatigue. Negative for fever.  HENT: Positive for sore throat.   Respiratory: Positive for cough.   Gastrointestinal: Negative for vomiting.  Musculoskeletal: Positive for myalgias.  All other systems reviewed and are negative.    Allergies  Review of patient's allergies indicates no known allergies.  Home Medications   Current Outpatient Rx  Name  Route  Sig  Dispense  Refill  . acetaminophen (TYLENOL) 325 MG tablet   Oral   Take 650 mg by mouth every 6 (six) hours as needed.         Marland Kitchen albuterol (PROVENTIL HFA;VENTOLIN HFA) 108 (90 BASE) MCG/ACT inhaler   Inhalation   Inhale 2 puffs into the lungs every 6 (six) hours as needed for wheezing or  shortness of breath.   1 Inhaler   2   . albuterol (PROVENTIL) (2.5 MG/3ML) 0.083% nebulizer solution   Nebulization   Take 2.5 mg by nebulization every 6 (six) hours as needed. For shortness of breath and wheezing         . amoxicillin (AMOXIL) 500 MG capsule   Oral   Take 1 capsule (500 mg total) by mouth 3 (three) times daily.   30 capsule   0   . chlorpheniramine-HYDROcodone (TUSSIONEX PENNKINETIC ER) 10-8 MG/5ML LQCR   Oral   Take 5 mLs by mouth every 12 (twelve) hours as needed for cough.   140 mL   0   . fluticasone (FLONASE) 50 MCG/ACT nasal spray   Each Nare   Place 2 sprays into both nostrils daily.   16 g   0   . HYDROcodone-acetaminophen (HYCET) 7.5-325 mg/15 ml solution   Oral   Take 10 mLs by mouth 4 (four) times daily as needed for moderate pain.   70 mL   0   . ibuprofen (ADVIL,MOTRIN) 200 MG tablet   Oral   Take 800 mg by mouth every 6 (six) hours as needed. For pain          . loratadine (CLARITIN) 10 MG tablet   Oral   Take 10 mg by mouth daily.           Marland Kitchen  LORazepam (ATIVAN) 1 MG tablet   Oral   Take 1-2 tablets (1-2 mg total) by mouth at bedtime as needed (for sleep).   15 tablet   0   . metroNIDAZOLE (FLAGYL) 500 MG tablet   Oral   Take 1 tablet (500 mg total) by mouth 2 (two) times daily.   14 tablet   0   . polyethylene glycol powder (GLYCOLAX/MIRALAX) powder   Oral   Take 17 g by mouth daily.   255 g   0   . predniSONE (DELTASONE) 10 MG tablet      6,5,4,3,2,1 taper   21 tablet   0   . pseudoephedrine (SUDAFED) 60 MG tablet   Oral   Take 1 tablet (60 mg total) by mouth every 6 (six) hours as needed for congestion.   30 tablet   0   . traMADol (ULTRAM) 50 MG tablet   Oral   Take 1 tablet (50 mg total) by mouth every 6 (six) hours as needed for pain.   15 tablet   0    BP 120/68  Pulse 97  Temp(Src) 98.2 F (36.8 C) (Oral)  Resp 20  Wt 150 lb (68.04 kg)  SpO2 99%  LMP 01/08/2013 Physical Exam   Constitutional: She is oriented to person, place, and time. She appears well-developed and well-nourished.  HENT:  Head: Normocephalic.  Right Ear: External ear normal.  Left Ear: External ear normal.  Nose: Mucosal edema present. Right sinus exhibits frontal sinus tenderness. Left sinus exhibits frontal sinus tenderness.  Mouth/Throat: Oropharynx is clear and moist.  Neck: Normal range of motion. Neck supple.  Cardiovascular: Normal rate and normal heart sounds.   No murmur heard. Pulmonary/Chest: Effort normal. She has no wheezes. She has rales. She exhibits no tenderness.  Abdominal: Soft. Bowel sounds are normal. She exhibits no distension. There is no tenderness.  Musculoskeletal: Normal range of motion.  Lymphadenopathy:    She has no cervical adenopathy.  Neurological: She is alert and oriented to person, place, and time.  Skin: Skin is warm and dry. No pallor.  Psychiatric: She has a normal mood and affect.    ED Course  Procedures (including critical care time) Labs Review Labs Reviewed - No data to display Imaging Review Dg Chest 2 View  01/22/2013   CLINICAL DATA:  Cough and congestion.  EXAM: CHEST  2 VIEW  COMPARISON:  Two-view chest 11/27/2012.  FINDINGS: The heart size and mediastinal contours are within normal limits. Both lungs are clear. The visualized skeletal structures are unremarkable.  IMPRESSION: Negative two view chest radiograph.   Electronically Signed   By: Gennette Pac M.D.   On: 01/22/2013 15:20    EKG Interpretation   None       MDM   1. URI (upper respiratory infection)    Chest x-ray without evidence of pneumonia. Suspect viral process requiring supportive care.     Arnoldo Hooker, PA-C 01/26/13 1950

## 2013-01-22 NOTE — ED Notes (Signed)
Patient transported to X-ray 

## 2013-01-22 NOTE — ED Notes (Signed)
Productive cough, congestion, fever appx a week

## 2013-01-27 NOTE — ED Provider Notes (Signed)
Medical screening examination/treatment/procedure(s) were performed by non-physician practitioner and as supervising physician I was immediately available for consultation/collaboration.  EKG Interpretation   None        Ethelda ChickMartha K Linker, MD 01/27/13 [id number]

## 2013-08-29 ENCOUNTER — Encounter (HOSPITAL_BASED_OUTPATIENT_CLINIC_OR_DEPARTMENT_OTHER): Payer: Self-pay | Admitting: Emergency Medicine

## 2013-08-29 ENCOUNTER — Emergency Department (HOSPITAL_BASED_OUTPATIENT_CLINIC_OR_DEPARTMENT_OTHER): Payer: 59

## 2013-08-29 ENCOUNTER — Emergency Department (HOSPITAL_BASED_OUTPATIENT_CLINIC_OR_DEPARTMENT_OTHER)
Admission: EM | Admit: 2013-08-29 | Discharge: 2013-08-29 | Disposition: A | Discharge status: Home / Self Care | Payer: 59 | Attending: Emergency Medicine | Admitting: Emergency Medicine

## 2013-08-29 DIAGNOSIS — F17200 Nicotine dependence, unspecified, uncomplicated: Secondary | ICD-10-CM | POA: Diagnosis not present

## 2013-08-29 DIAGNOSIS — S8990XA Unspecified injury of unspecified lower leg, initial encounter: Secondary | ICD-10-CM | POA: Insufficient documentation

## 2013-08-29 DIAGNOSIS — K5730 Diverticulosis of large intestine without perforation or abscess without bleeding: Secondary | ICD-10-CM | POA: Insufficient documentation

## 2013-08-29 DIAGNOSIS — Z79899 Other long term (current) drug therapy: Secondary | ICD-10-CM | POA: Insufficient documentation

## 2013-08-29 DIAGNOSIS — S93409A Sprain of unspecified ligament of unspecified ankle, initial encounter: Secondary | ICD-10-CM | POA: Diagnosis not present

## 2013-08-29 DIAGNOSIS — S99929A Unspecified injury of unspecified foot, initial encounter: Secondary | ICD-10-CM

## 2013-08-29 DIAGNOSIS — R296 Repeated falls: Secondary | ICD-10-CM | POA: Insufficient documentation

## 2013-08-29 DIAGNOSIS — S92109A Unspecified fracture of unspecified talus, initial encounter for closed fracture: Secondary | ICD-10-CM | POA: Diagnosis not present

## 2013-08-29 DIAGNOSIS — S93401A Sprain of unspecified ligament of right ankle, initial encounter: Secondary | ICD-10-CM

## 2013-08-29 DIAGNOSIS — Z791 Long term (current) use of non-steroidal anti-inflammatories (NSAID): Secondary | ICD-10-CM | POA: Insufficient documentation

## 2013-08-29 DIAGNOSIS — Z792 Long term (current) use of antibiotics: Secondary | ICD-10-CM | POA: Insufficient documentation

## 2013-08-29 DIAGNOSIS — F410 Panic disorder [episodic paroxysmal anxiety] without agoraphobia: Secondary | ICD-10-CM | POA: Insufficient documentation

## 2013-08-29 DIAGNOSIS — Y9389 Activity, other specified: Secondary | ICD-10-CM | POA: Diagnosis not present

## 2013-08-29 DIAGNOSIS — IMO0002 Reserved for concepts with insufficient information to code with codable children: Secondary | ICD-10-CM | POA: Insufficient documentation

## 2013-08-29 DIAGNOSIS — J45909 Unspecified asthma, uncomplicated: Secondary | ICD-10-CM | POA: Diagnosis not present

## 2013-08-29 DIAGNOSIS — S82891A Other fracture of right lower leg, initial encounter for closed fracture: Secondary | ICD-10-CM

## 2013-08-29 DIAGNOSIS — S99919A Unspecified injury of unspecified ankle, initial encounter: Secondary | ICD-10-CM

## 2013-08-29 DIAGNOSIS — Y9289 Other specified places as the place of occurrence of the external cause: Secondary | ICD-10-CM | POA: Insufficient documentation

## 2013-08-29 NOTE — ED Provider Notes (Signed)
CSN: [id number]     Arrival date & time 08/29/13  1749 History   First MD Initiated Contact with Patient 08/29/13 1821     Chief Complaint  Patient presents with  . Ankle Pain     (Consider location/radiation/quality/duration/timing/severity/associated sxs/prior Treatment) Patient is a 38 y.o. female presenting with ankle pain. The history is provided by the patient and medical records. No language interpreter was used.  Ankle Pain Associated symptoms: no back pain, no fever and no neck pain     Emily Stuart is a 38 y.o. female  with a hx of asthma, anxiety presents to the Emergency Department complaining of gradual, persistent, progressively worsening right ankle pain worse on the lateral side after falling off a curb onset yesterday. Associated symptoms include swelling and ecchymosis.  Pt has elevated and used ice without relief and worsening pain throughout the day.  Tylenol makes it better and walking makes it worse.  Pt denies fever, weakness, numbness.     Past Medical History  Diagnosis Date  . Asthma   . Anxiety   . Panic attacks   . Diverticulosis    Past Surgical History  Procedure Laterality Date  . Oophorectomy     History reviewed. No pertinent family history. History  Substance Use Topics  . Smoking status: Current Every Day Smoker -- 1.00 packs/day for 20 years    Types: Cigarettes  . Smokeless tobacco: Never Used  . Alcohol Use: No   OB History   Grav Para Term Preterm Abortions TAB SAB Ect Mult Living                 Review of Systems  Constitutional: Negative for fever and chills.  Gastrointestinal: Negative for nausea and vomiting.  Musculoskeletal: Positive for arthralgias and joint swelling. Negative for back pain, neck pain and neck stiffness.  Skin: Negative for wound.  Neurological: Negative for numbness.  Hematological: Does not bruise/bleed easily.  Psychiatric/Behavioral: The patient is not nervous/anxious.   All other systems reviewed and  are negative.     Allergies  Review of patient's allergies indicates no known allergies.  Home Medications   Prior to Admission medications   Medication Sig Start Date End Date Taking? Authorizing Provider  acetaminophen (TYLENOL) 325 MG tablet Take 650 mg by mouth every 6 (six) hours as needed.    Historical Provider, MD  albuterol (PROVENTIL HFA;VENTOLIN HFA) 108 (90 BASE) MCG/ACT inhaler Inhale 2 puffs into the lungs every 6 (six) hours as needed for wheezing or shortness of breath. 01/22/13   Shari A Upstill, PA-C  albuterol (PROVENTIL) (2.5 MG/3ML) 0.083% nebulizer solution Take 2.5 mg by nebulization every 6 (six) hours as needed. For shortness of breath and wheezing    Historical Provider, MD  amoxicillin (AMOXIL) 500 MG capsule Take 1 capsule (500 mg total) by mouth 3 (three) times daily. 05/11/12   Elson Areas, PA-C  chlorpheniramine-HYDROcodone [facility] ER) 10-8 MG/5ML LQCR Take 5 mLs by mouth every 12 (twelve) hours as needed for cough. 11/27/12   Teressa Lower, NP  fluticasone (FLONASE) 50 MCG/ACT nasal spray Place 2 sprays into both nostrils daily. 01/22/13   Shari A Upstill, PA-C  HYDROcodone-acetaminophen (HYCET) 7.5-325 mg/15 ml solution Take 10 mLs by mouth 4 (four) times daily as needed for moderate pain. 01/22/13   Shari A Upstill, PA-C  ibuprofen (ADVIL,MOTRIN) 200 MG tablet Take 800 mg by mouth every 6 (six) hours as needed. For pain     Historical Provider, MD  loratadine (CLARITIN) 10 MG tablet Take 10 mg by mouth daily.      Historical Provider, MD  LORazepam (ATIVAN) 1 MG tablet Take 1-2 tablets (1-2 mg total) by mouth at bedtime as needed (for sleep). 11/13/11   John L Molpus, MD  metroNIDAZOLE (FLAGYL) 500 MG tablet Take 1 tablet (500 mg total) by mouth 2 (two) times daily. 01/14/12   Elson AreasLeslie K Sofia, PA-C  polyethylene glycol powder (GLYCOLAX/MIRALAX) powder Take 17 g by mouth daily. 08/11/12   Renne CriglerJoshua Geiple, PA-C  predniSONE (DELTASONE) 10 MG  tablet 6,5,4,3,2,1 taper 01/22/13   Shari A Upstill, PA-C  pseudoephedrine (SUDAFED) 60 MG tablet Take 1 tablet (60 mg total) by mouth every 6 (six) hours as needed for congestion. 12/12/11   Charles B. Bernette MayersSheldon, MD  traMADol (ULTRAM) 50 MG tablet Take 1 tablet (50 mg total) by mouth every 6 (six) hours as needed for pain. 12/12/11   Charles B. Bernette MayersSheldon, MD   BP 112/71  Pulse 88  Temp(Src) 98.4 F (36.9 C) (Oral)  Resp 16  Ht 5\' 4"  (1.626 m)  Wt 145 lb (65.772 kg)  BMI 24.88 kg/m2  SpO2 98%  LMP 07/24/2013 Physical Exam  Nursing note and vitals reviewed. Constitutional: She appears well-developed and well-nourished. No distress.  HENT:  Head: Normocephalic and atraumatic.  Eyes: Conjunctivae are normal.  Neck: Normal range of motion.  Cardiovascular: Normal rate, regular rhythm, normal heart sounds and intact distal pulses.   No murmur heard. Capillary refill < 3 sec  Pulmonary/Chest: Effort normal and breath sounds normal.  Musculoskeletal: She exhibits tenderness. She exhibits no edema.  ROM: mildly decreased ROM of the right ankle due to pain and swelling TTP over the lateral malleolus and LCL of the ankle, mild TTP over the talonavicular joint, but less so than the LCL.   Neurological: She is alert. Coordination normal.  Sensation intact to dull and sharp Strength 5/5 in the BLE including dorsiflexion and plantarflexion  Skin: Skin is warm and dry. She is not diaphoretic.  No tenting of the skin  Psychiatric: She has a normal mood and affect.    ED Course  Procedures (including critical care time) Labs Review Labs Reviewed - No data to display  Imaging Review Dg Ankle Complete Right  08/29/2013   CLINICAL DATA:  Right ankle injury and pain.  EXAM: RIGHT ANKLE - COMPLETE 3+ VIEW  COMPARISON:  None.  FINDINGS: A tiny ossific density is seen along the dorsal aspect of the talonavicular joint, suspicious for a tiny avulsion fracture fragment. No other fractures are identified.  No evidence of dislocation.  IMPRESSION: Tiny ossific density along the dorsal aspect of the talonavicular joint, suspicious for tiny avulsion fracture fragment. Recommend clinical correlation for point tenderness at this site.   Electronically Signed   By: Myles RosenthalJohn  Stahl M.D.   On: 08/29/2013 18:22     EKG Interpretation None      MDM   Final diagnoses:  Right ankle sprain, initial encounter  Avulsion fracture of ankle, right, closed, initial encounter    Emily CornerGladys Gainey presents with right ankle pain after twisting her right ankle.  TTP over the lateral malleolus and joint line.  Will obtain x-ray.    7:01 PM  Patient X-Ray with Tiny ossific density along the dorsal aspect of the talonavicular joint, suspicious for tiny avulsion fracture fragment.  Pt declines pain management in the ED. Pt advised to follow up with orthopedics for further evaluation and treatment if symptoms persist for  greater than 1-2 weeks.  Patient given CAM walker while in ED, conservative therapy recommended and discussed. Patient will be dc home & is agreeable with above plan. I have also discussed reasons to return immediately to the ER.  Patient expresses understanding and agrees with plan.  BP 112/71  Pulse 88  Temp(Src) 98.4 F (36.9 C) (Oral)  Resp 16  Ht 5\' 4"  (1.626 m)  Wt 145 lb (65.772 kg)  BMI 24.88 kg/m2  SpO2 98%  LMP 07/24/2013      Dierdre Forth, PA-C 08/29/13 1902

## 2013-08-29 NOTE — ED Notes (Signed)
Pt c/o right ankle injury fell off curb x 1 day ago

## 2013-08-29 NOTE — ED Provider Notes (Signed)
Medical screening examination/treatment/procedure(s) were performed by non-physician practitioner and as supervising physician I was immediately available for consultation/collaboration.   EKG Interpretation None        Purvis SheffieldForrest Gerod Caligiuri, MD 08/29/13 1905

## 2013-08-29 NOTE — Discharge Instructions (Signed)
1. Medications: tylenol as needed for pain, usual home medications 2. Treatment: rest, drink plenty of fluids,  3. Follow Up: Please followup with Dr. Pearletha Forge for further evaluation of your foot if pain persists for > 2 weeks   Ankle Sprain An ankle sprain is an injury to the strong, fibrous tissues (ligaments) that hold the bones of your ankle joint together.  CAUSES An ankle sprain is usually caused by a fall or by twisting your ankle. Ankle sprains most commonly occur when you step on the outer edge of your foot, and your ankle turns inward. People who participate in sports are more prone to these types of injuries.  SYMPTOMS   Pain in your ankle. The pain may be present at rest or only when you are trying to stand or walk.  Swelling.  Bruising. Bruising may develop immediately or within 1 to 2 days after your injury.  Difficulty standing or walking, particularly when turning corners or changing directions. DIAGNOSIS  Your caregiver will ask you details about your injury and perform a physical exam of your ankle to determine if you have an ankle sprain. During the physical exam, your caregiver will press on and apply pressure to specific areas of your foot and ankle. Your caregiver will try to move your ankle in certain ways. An X-ray exam may be done to be sure a bone was not broken or a ligament did not separate from one of the bones in your ankle (avulsion fracture).  TREATMENT  Certain types of braces can help stabilize your ankle. Your caregiver can make a recommendation for this. Your caregiver may recommend the use of medicine for pain. If your sprain is severe, your caregiver may refer you to a surgeon who helps to restore function to parts of your skeletal system (orthopedist) or a physical therapist. HOME CARE INSTRUCTIONS   Apply ice to your injury for 1-2 days or as directed by your caregiver. Applying ice helps to reduce inflammation and pain.  Put ice in a plastic  bag.  Place a towel between your skin and the bag.  Leave the ice on for 15-20 minutes at a time, every 2 hours while you are awake.  Only take over-the-counter or prescription medicines for pain, discomfort, or fever as directed by your caregiver.  Elevate your injured ankle above the level of your heart as much as possible for 2-3 days.  If your caregiver recommends crutches, use them as instructed. Gradually put weight on the affected ankle. Continue to use crutches or a cane until you can walk without feeling pain in your ankle.  If you have a plaster splint, wear the splint as directed by your caregiver. Do not rest it on anything harder than a pillow for the first 24 hours. Do not put weight on it. Do not get it wet. You may take it off to take a shower or bath.  You may have been given an elastic bandage to wear around your ankle to provide support. If the elastic bandage is too tight (you have numbness or tingling in your foot or your foot becomes cold and blue), adjust the bandage to make it comfortable.  If you have an air splint, you may blow more air into it or let air out to make it more comfortable. You may take your splint off at night and before taking a shower or bath. Wiggle your toes in the splint several times per day to decrease swelling. SEEK MEDICAL CARE IF:  You have rapidly increasing bruising or swelling.  Your toes feel extremely cold or you lose feeling in your foot.  Your pain is not relieved with medicine. SEEK IMMEDIATE MEDICAL CARE IF:  Your toes are numb or blue.  You have severe pain that is increasing. MAKE SURE YOU:   Understand these instructions.  Will watch your condition.  Will get help right away if you are not doing well or get worse. Document Released: 01/10/2005 Document Revised: 10/05/2011 Document Reviewed: 01/22/2011 [facility] Patient Information 2015 [place], [place]. This information is not intended to replace advice given to you by  your health care provider. Make sure you discuss any questions you have with your health care provider.

## 2013-10-18 ENCOUNTER — Encounter (HOSPITAL_BASED_OUTPATIENT_CLINIC_OR_DEPARTMENT_OTHER): Payer: Self-pay | Admitting: Emergency Medicine

## 2013-10-18 ENCOUNTER — Emergency Department (HOSPITAL_BASED_OUTPATIENT_CLINIC_OR_DEPARTMENT_OTHER)
Admission: EM | Admit: 2013-10-18 | Discharge: 2013-10-18 | Disposition: A | Discharge status: Home / Self Care | Payer: 59 | Attending: Emergency Medicine | Admitting: Emergency Medicine

## 2013-10-18 DIAGNOSIS — J45909 Unspecified asthma, uncomplicated: Secondary | ICD-10-CM | POA: Insufficient documentation

## 2013-10-18 DIAGNOSIS — J0110 Acute frontal sinusitis, unspecified: Secondary | ICD-10-CM | POA: Insufficient documentation

## 2013-10-18 DIAGNOSIS — Z8719 Personal history of other diseases of the digestive system: Secondary | ICD-10-CM | POA: Insufficient documentation

## 2013-10-18 DIAGNOSIS — F17200 Nicotine dependence, unspecified, uncomplicated: Secondary | ICD-10-CM | POA: Insufficient documentation

## 2013-10-18 DIAGNOSIS — IMO0002 Reserved for concepts with insufficient information to code with codable children: Secondary | ICD-10-CM | POA: Diagnosis not present

## 2013-10-18 DIAGNOSIS — R059 Cough, unspecified: Secondary | ICD-10-CM | POA: Diagnosis present

## 2013-10-18 DIAGNOSIS — F410 Panic disorder [episodic paroxysmal anxiety] without agoraphobia: Secondary | ICD-10-CM | POA: Diagnosis not present

## 2013-10-18 DIAGNOSIS — R05 Cough: Secondary | ICD-10-CM | POA: Insufficient documentation

## 2013-10-18 DIAGNOSIS — Z79899 Other long term (current) drug therapy: Secondary | ICD-10-CM | POA: Insufficient documentation

## 2013-10-18 MED ORDER — AMOXICILLIN 500 MG PO CAPS: 500.0000 mg | ORAL_CAPSULE | Freq: Three times a day (TID) | ORAL | Status: AC

## 2013-10-18 NOTE — ED Provider Notes (Signed)
CSN: [id number]     Arrival date & time 10/18/13  1148 History   First MD Initiated Contact with Patient 10/18/13 1212     Chief Complaint  Patient presents with  . Cough     (Consider location/radiation/quality/duration/timing/severity/associated sxs/prior Treatment) HPI Comments: Pt states that she has had cough and congestion times one week. Pt states that she initially thought it was allergies so she started taking allergy medication but symptoms continued and have worsened. Now she has drainage from her eye, facial pressure and headache. No fever.   The history is provided by the patient. No language interpreter was used.    Past Medical History  Diagnosis Date  . Asthma   . Anxiety   . Panic attacks   . Diverticulosis    Past Surgical History  Procedure Laterality Date  . Oophorectomy     No family history on file. History  Substance Use Topics  . Smoking status: Current Every Day Smoker -- 1.00 packs/day for 20 years    Types: Cigarettes  . Smokeless tobacco: Never Used  . Alcohol Use: No   OB History   Grav Para Term Preterm Abortions TAB SAB Ect Mult Living                 Review of Systems  Constitutional: Negative for fever.  Respiratory: Positive for cough.   Cardiovascular: Negative.       Allergies  Review of patient's allergies indicates no known allergies.  Home Medications   Prior to Admission medications   Medication Sig Start Date End Date Taking? Authorizing Provider  acetaminophen (TYLENOL) 325 MG tablet Take 650 mg by mouth every 6 (six) hours as needed.    Historical Provider, MD  albuterol (PROVENTIL HFA;VENTOLIN HFA) 108 (90 BASE) MCG/ACT inhaler Inhale 2 puffs into the lungs every 6 (six) hours as needed for wheezing or shortness of breath. 01/22/13   Shari A Upstill, PA-C  albuterol (PROVENTIL) (2.5 MG/3ML) 0.083% nebulizer solution Take 2.5 mg by nebulization every 6 (six) hours as needed. For shortness of breath and wheezing     Historical Provider, MD  amoxicillin (AMOXIL) 500 MG capsule Take 1 capsule (500 mg total) by mouth 3 (three) times daily. 05/11/12   Elson Areas, PA-C  amoxicillin (AMOXIL) 500 MG capsule Take 1 capsule (500 mg total) by mouth 3 (three) times daily. 10/18/13   Teressa Lower, NP  chlorpheniramine-HYDROcodone (TUSSIONEX PENNKINETIC ER) 10-8 MG/5ML LQCR Take 5 mLs by mouth every 12 (twelve) hours as needed for cough. 11/27/12   Teressa Lower, NP  fluticasone (FLONASE) 50 MCG/ACT nasal spray Place 2 sprays into both nostrils daily. 01/22/13   Shari A Upstill, PA-C  HYDROcodone-acetaminophen (HYCET) 7.5-325 mg/15 ml solution Take 10 mLs by mouth 4 (four) times daily as needed for moderate pain. 01/22/13   Shari A Upstill, PA-C  ibuprofen (ADVIL,MOTRIN) 200 MG tablet Take 800 mg by mouth every 6 (six) hours as needed. For pain     Historical Provider, MD  loratadine (CLARITIN) 10 MG tablet Take 10 mg by mouth daily.      Historical Provider, MD  LORazepam (ATIVAN) 1 MG tablet Take 1-2 tablets (1-2 mg total) by mouth at bedtime as needed (for sleep). 11/13/11   John L Molpus, MD  metroNIDAZOLE (FLAGYL) 500 MG tablet Take 1 tablet (500 mg total) by mouth 2 (two) times daily. 01/14/12   Elson Areas, PA-C  polyethylene glycol powder (GLYCOLAX/MIRALAX) powder Take 17 g by mouth daily. 08/11/12  Renne Crigler, PA-C  predniSONE (DELTASONE) 10 MG tablet 6,5,4,3,2,1 taper 01/22/13   Shari A Upstill, PA-C  pseudoephedrine (SUDAFED) 60 MG tablet Take 1 tablet (60 mg total) by mouth every 6 (six) hours as needed for congestion. 12/12/11   Charles B. Bernette Mayers, MD  traMADol (ULTRAM) 50 MG tablet Take 1 tablet (50 mg total) by mouth every 6 (six) hours as needed for pain. 12/12/11   Charles B. Bernette Mayers, MD   BP 116/74  Pulse 86  Temp(Src) 98.3 F (36.8 C) (Oral)  Resp 20  Ht  (1.651 m)  Wt 115 lb (52.164 kg)  BMI 19.14 kg/m2  SpO2 100%  LMP 10/18/2013 Physical Exam  Nursing note and vitals  reviewed. Constitutional: She is oriented to person, place, and time. She appears well-developed and well-nourished.  HENT:  Right Ear: External ear normal.  Left Ear: External ear normal.  Mouth/Throat: Posterior oropharyngeal erythema present.  Bilateral frontal sinusitis tender  Eyes: EOM are normal. Pupils are equal, round, and reactive to light.  Conjunctiva injected bilaterally with congestion noted  Neck: Normal range of motion. Neck supple.  Cardiovascular: Normal rate and regular rhythm.   Pulmonary/Chest: Breath sounds normal.  Neurological: She is alert and oriented to person, place, and time.    ED Course  Procedures (including critical care time) Labs Review Labs Reviewed - No data to display  Imaging Review No results found.   EKG Interpretation None      MDM   Final diagnoses:  Acute frontal sinusitis, recurrence not specified   Will treat for sinusitis with amoxicillin. Pt instructed on use of flonase as well    Teressa Lower, NP 10/18/13 1236

## 2013-10-18 NOTE — ED Notes (Signed)
Reports cough and sinus congestion x 1 week. Productive cough.

## 2013-10-18 NOTE — ED Provider Notes (Signed)
Medical screening examination/treatment/procedure(s) were performed by non-physician practitioner and as supervising physician I was immediately available for consultation/collaboration.   Marylen Zuk David Melani Brisbane III, MD 10/18/13 1530 

## 2013-10-18 NOTE — Discharge Instructions (Signed)
Sinusitis °Sinusitis is redness, soreness, and puffiness (inflammation) of the air pockets in the bones of your face (sinuses). The redness, soreness, and puffiness can cause air and mucus to get trapped in your sinuses. This can allow germs to grow and cause an infection.  °HOME CARE  °· Drink enough fluids to keep your pee (urine) clear or pale yellow. °· Use a humidifier in your home. °· Run a hot shower to create steam in the bathroom. Sit in the bathroom with the door closed. Breathe in the steam 3-4 times a day. °· Put a warm, moist washcloth on your face 3-4 times a day, or as told by your doctor. °· Use salt water sprays (saline sprays) to wet the thick fluid in your nose. This can help the sinuses drain. °· Only take medicine as told by your doctor. °GET HELP RIGHT AWAY IF:  °· Your pain gets worse. °· You have very bad headaches. °· You are sick to your stomach (nauseous). °· You throw up (vomit). °· You are very sleepy (drowsy) all the time. °· Your face is puffy (swollen). °· Your vision changes. °· You have a stiff neck. °· You have trouble breathing. °MAKE SURE YOU:  °· Understand these instructions. °· Will watch your condition. °· Will get help right away if you are not doing well or get worse. °Document Released: 06/29/2007 Document Revised: 10/05/2011 Document Reviewed: 08/16/2011 °[employer]® Patient Information ©2015 [employer]. This information is not intended to replace advice given to you by your health care provider. Make sure you discuss any questions you have with your health care provider. ° °

## 2014-04-13 ENCOUNTER — Encounter (HOSPITAL_BASED_OUTPATIENT_CLINIC_OR_DEPARTMENT_OTHER): Payer: Self-pay | Admitting: *Deleted

## 2014-04-13 ENCOUNTER — Emergency Department (HOSPITAL_BASED_OUTPATIENT_CLINIC_OR_DEPARTMENT_OTHER)
Admission: EM | Admit: 2014-04-13 | Discharge: 2014-04-13 | Discharge status: Against Medical Advice | Payer: 59 | Attending: Emergency Medicine | Admitting: Emergency Medicine

## 2014-04-13 DIAGNOSIS — Z720 Tobacco use: Secondary | ICD-10-CM | POA: Insufficient documentation

## 2014-04-13 DIAGNOSIS — R0602 Shortness of breath: Secondary | ICD-10-CM | POA: Diagnosis not present

## 2014-04-13 DIAGNOSIS — J45909 Unspecified asthma, uncomplicated: Secondary | ICD-10-CM | POA: Insufficient documentation

## 2014-04-13 DIAGNOSIS — R51 Headache: Secondary | ICD-10-CM | POA: Diagnosis present

## 2014-04-13 DIAGNOSIS — R42 Dizziness and giddiness: Secondary | ICD-10-CM | POA: Diagnosis not present

## 2014-04-13 DIAGNOSIS — R0981 Nasal congestion: Secondary | ICD-10-CM | POA: Insufficient documentation

## 2014-04-13 NOTE — ED Notes (Addendum)
Pt called by Nurse First x3 for Reassessment. No answer. Charge Notified.

## 2014-04-13 NOTE — ED Notes (Signed)
C/o facial/ sinus pain, nasal congestion and HA. Onset Tuesday. Rates pain 8/10. Had sudafed 3 hrs ago. (Denies: fever or nv), admits to some intermittent sob and dizziness. Alert, NAD, calm, interactive.

## 2014-11-23 IMAGING — CR DG ANKLE COMPLETE 3+V*R*
3 series · 3 of 3 positions shown · non-contrast
Comparison: None.

CLINICAL DATA: Right ankle injury and pain.

EXAM:
RIGHT ANKLE - COMPLETE 3+ VIEW

[t ankle joint ap right]
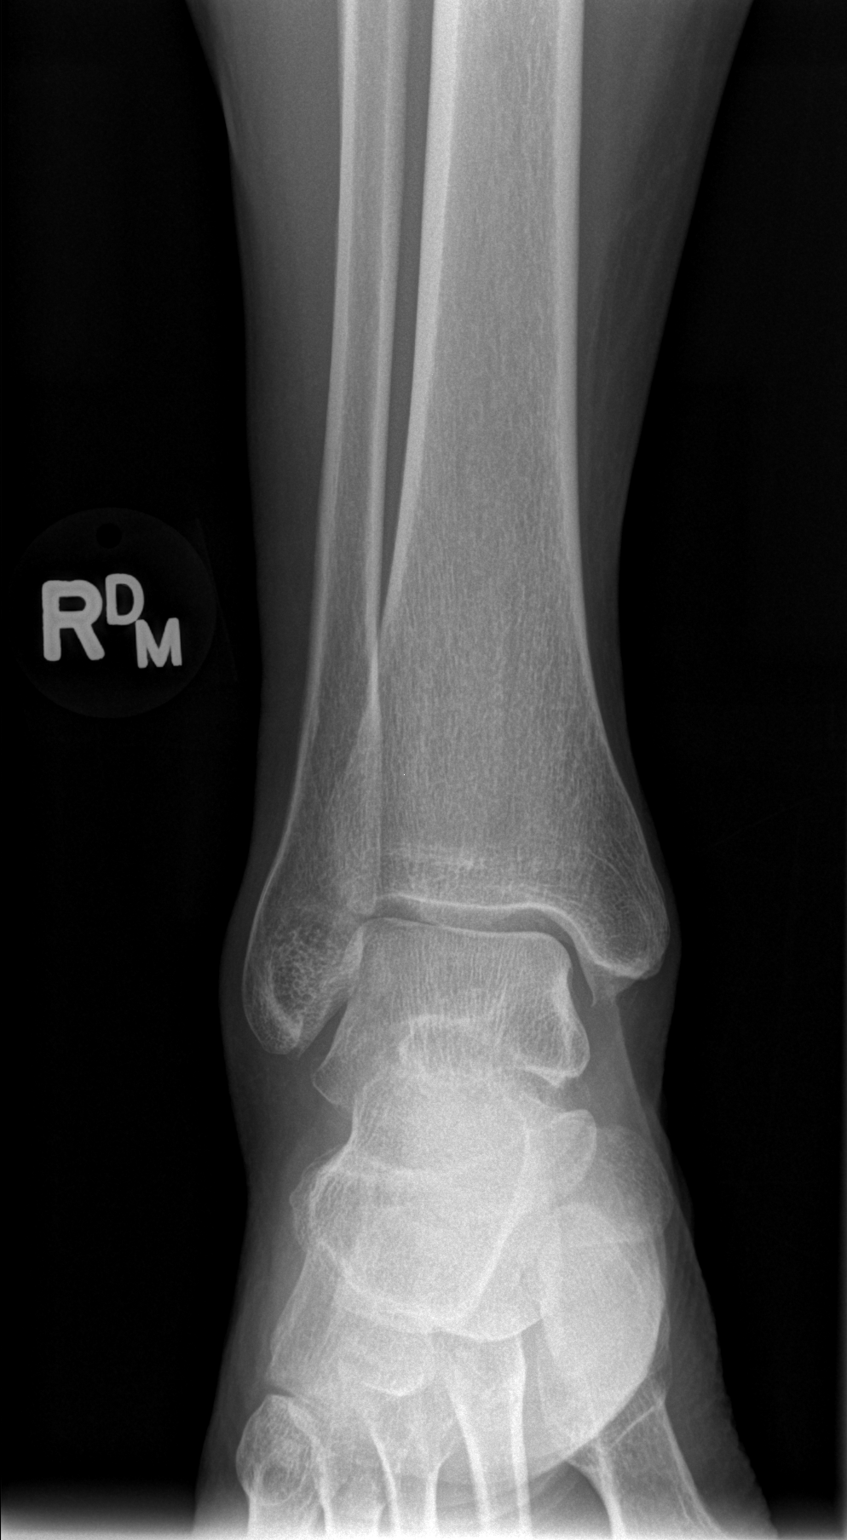

[t ankle joint oblique right]
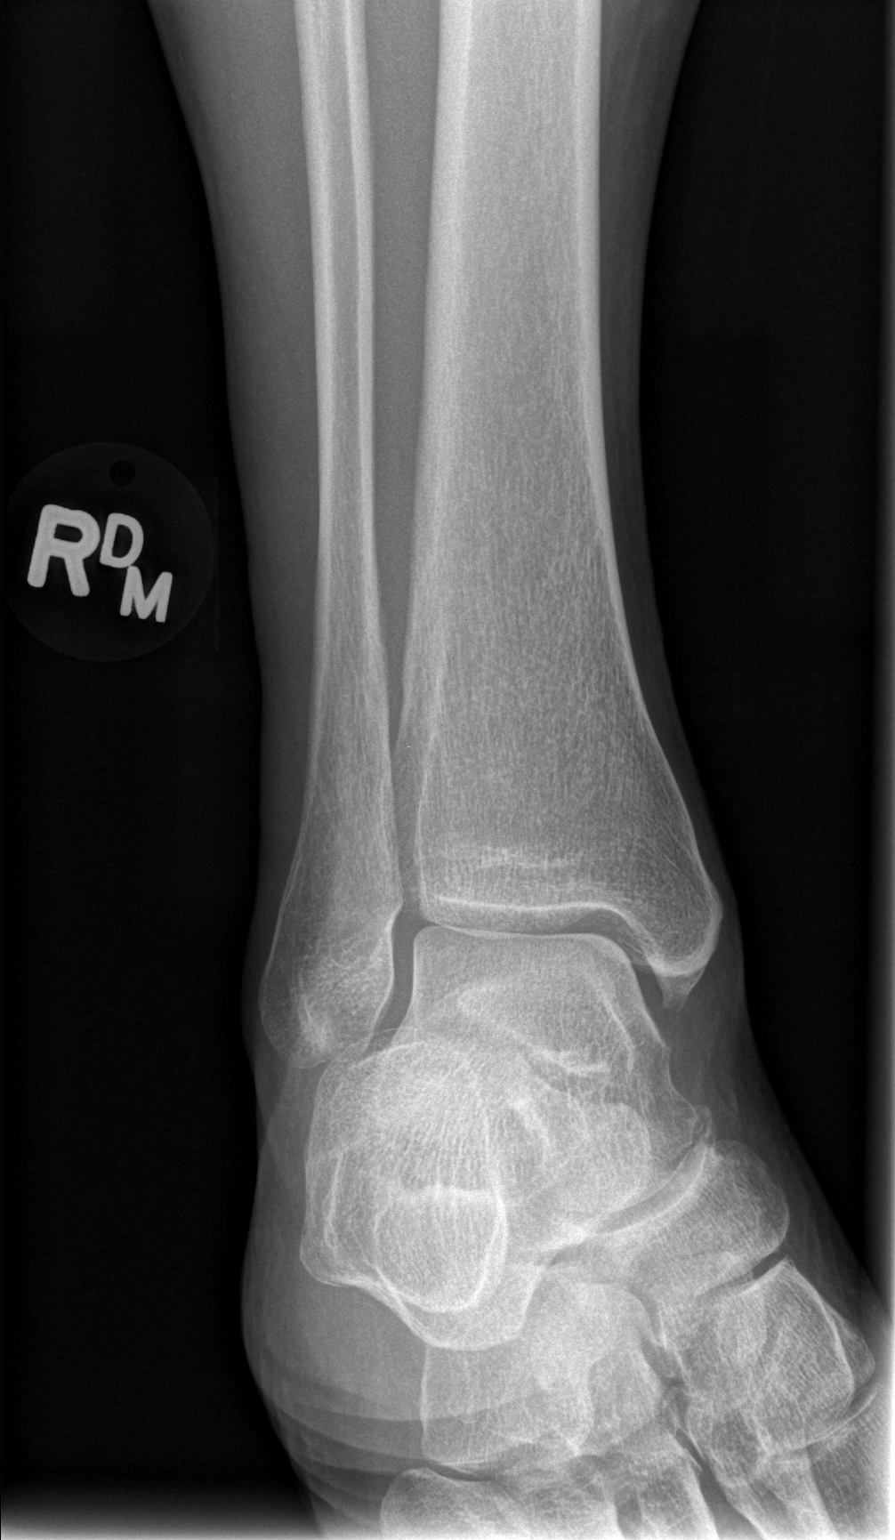

[t ankle joint lat right]
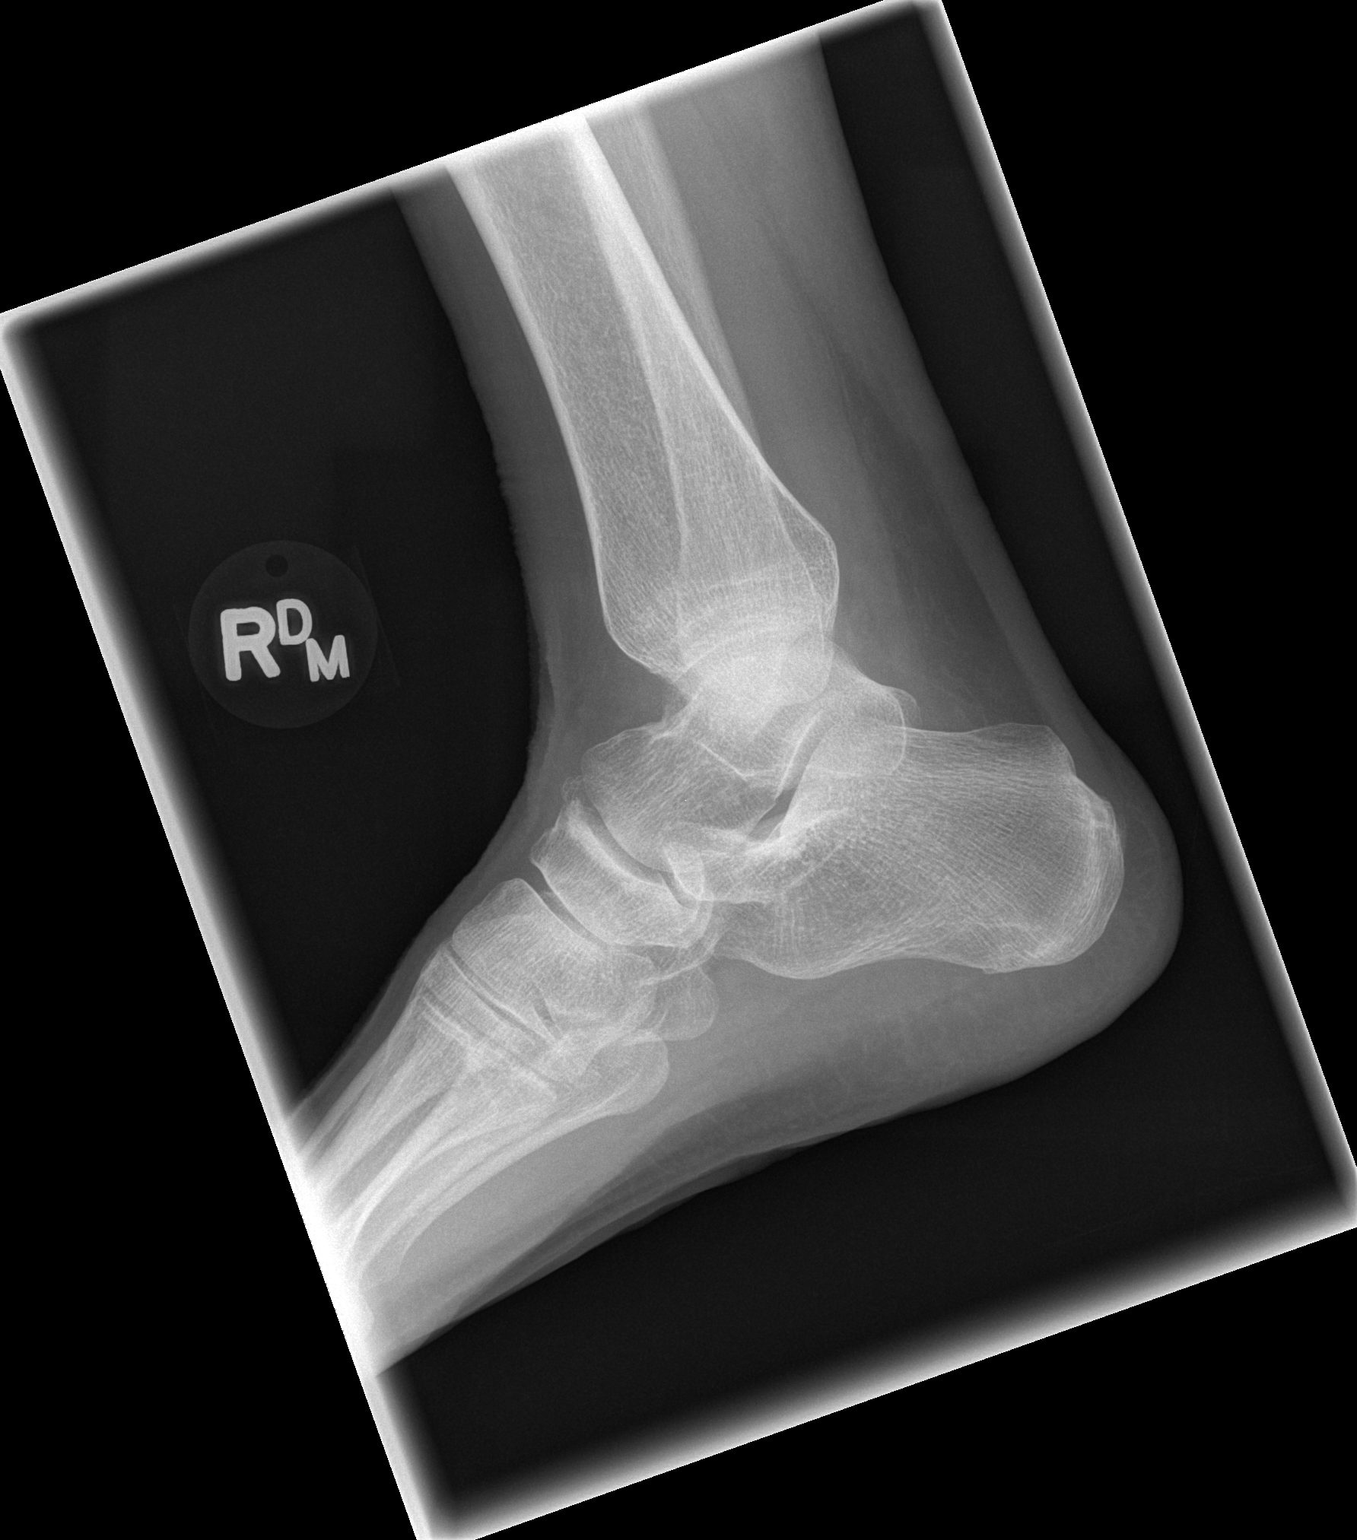

[3 of 3 positions shown; findings below may reference images not displayed]

FINDINGS: A tiny ossific density is seen along the dorsal aspect of the
talonavicular joint, suspicious for a tiny avulsion fracture
fragment. No other fractures are identified. No evidence of
dislocation.
IMPRESSION: Tiny ossific density along the dorsal aspect of the talonavicular
joint, suspicious for tiny avulsion fracture fragment. Recommend
clinical correlation for point tenderness at this site.

## 2017-05-25 ENCOUNTER — Emergency Department (HOSPITAL_BASED_OUTPATIENT_CLINIC_OR_DEPARTMENT_OTHER)
Admission: EM | Admit: 2017-05-25 | Discharge: 2017-05-25 | Disposition: A | Discharge status: Home / Self Care | Payer: BLUE CROSS/BLUE SHIELD | Attending: Emergency Medicine | Admitting: Emergency Medicine

## 2017-05-25 ENCOUNTER — Other Ambulatory Visit: Payer: Self-pay

## 2017-05-25 ENCOUNTER — Encounter (HOSPITAL_BASED_OUTPATIENT_CLINIC_OR_DEPARTMENT_OTHER): Payer: Self-pay | Admitting: *Deleted

## 2017-05-25 DIAGNOSIS — Y939 Activity, unspecified: Secondary | ICD-10-CM | POA: Diagnosis not present

## 2017-05-25 DIAGNOSIS — H18821 Corneal disorder due to contact lens, right eye: Secondary | ICD-10-CM | POA: Diagnosis not present

## 2017-05-25 DIAGNOSIS — H5789 Other specified disorders of eye and adnexa: Secondary | ICD-10-CM | POA: Diagnosis present

## 2017-05-25 DIAGNOSIS — Z79899 Other long term (current) drug therapy: Secondary | ICD-10-CM | POA: Diagnosis not present

## 2017-05-25 DIAGNOSIS — Y999 Unspecified external cause status: Secondary | ICD-10-CM | POA: Diagnosis not present

## 2017-05-25 DIAGNOSIS — Y929 Unspecified place or not applicable: Secondary | ICD-10-CM | POA: Diagnosis not present

## 2017-05-25 DIAGNOSIS — H1013 Acute atopic conjunctivitis, bilateral: Secondary | ICD-10-CM

## 2017-05-25 DIAGNOSIS — X58XXXA Exposure to other specified factors, initial encounter: Secondary | ICD-10-CM | POA: Insufficient documentation

## 2017-05-25 DIAGNOSIS — S0501XA Injury of conjunctiva and corneal abrasion without foreign body, right eye, initial encounter: Secondary | ICD-10-CM | POA: Diagnosis not present

## 2017-05-25 DIAGNOSIS — F17210 Nicotine dependence, cigarettes, uncomplicated: Secondary | ICD-10-CM | POA: Diagnosis not present

## 2017-05-25 MED ORDER — OFLOXACIN 0.3 % OP SOLN: 1.0000 [drp] | Freq: Four times a day (QID) | OPHTHALMIC | 0 refills | Status: AC

## 2017-05-25 MED ORDER — TETRACAINE HCL 0.5 % OP SOLN
2.0000 [drp] | Freq: Once | OPHTHALMIC | Status: DC
  Filled 2017-05-25: qty 4

## 2017-05-25 MED ORDER — NAPHAZOLINE-PHENIRAMINE 0.025-0.3 % OP SOLN: 1.0000 [drp] | Freq: Four times a day (QID) | OPHTHALMIC | 0 refills | Status: AC | PRN

## 2017-05-25 MED ORDER — FLUORESCEIN SODIUM 1 MG OP STRP
1.0000 | ORAL_STRIP | Freq: Once | OPHTHALMIC | Status: DC
  Filled 2017-05-25: qty 1

## 2017-05-25 NOTE — ED Provider Notes (Signed)
[facility] EMERGENCY DEPARTMENT Provider Note   CSN: [id number] Arrival date & time: 05/25/17  1747     History   Chief Complaint Chief Complaint  Patient presents with  . Eye Problem    HPI Emily Stuart is a 42 y.o. female with a history of allergies, asthma who presents emergency department today for bilateral eye redness.  Patient states that she has been dealing with allergies over the last several days.  She notes that she has been having itchy, watery eyes, sneezing, rhinorrhea.  She has started Zyrtec and Flonase for her symptoms but notes that her eyes have been very irritated.  She notes that yesterday her right contact folded and was stuck behind her eye.  It took some time in order for it to be taken out.  She has noted some increased irritation in her right eye since that time.  She is tried over-the-counter allergy eyedrops for her symptoms without any relief. Denies fever, HA, N/V, loss of vision, changes in vision, flashers, floaters, blurring, diplopia, photophobia, FB sensation, trauma, rash, pain or painful EOM.   HPI  Past Medical History:  Diagnosis Date  . Anxiety   . Asthma   . Diverticulosis   . Panic attacks     There are no active problems to display for this patient.   Past Surgical History:  Procedure Laterality Date  . OOPHORECTOMY       OB History   None      Home Medications    Prior to Admission medications   Medication Sig Start Date End Date Taking? Authorizing Provider  acetaminophen (TYLENOL) 325 MG tablet Take 650 mg by mouth every 6 (six) hours as needed.    [provider]  albuterol (PROVENTIL HFA;VENTOLIN HFA) 108 (90 BASE) MCG/ACT inhaler Inhale 2 puffs into the lungs every 6 (six) hours as needed for wheezing or shortness of breath. 01/22/13   Elpidio Anis, PA-C  albuterol (PROVENTIL) (2.5 MG/3ML) 0.083% nebulizer solution Take 2.5 mg by nebulization every 6 (six) hours as needed. For shortness of  breath and wheezing    [provider]  amoxicillin (AMOXIL) 500 MG capsule Take 1 capsule (500 mg total) by mouth 3 (three) times daily. 05/11/12   Elson Areas, PA-C  amoxicillin (AMOXIL) 500 MG capsule Take 1 capsule (500 mg total) by mouth 3 (three) times daily. 10/18/13   Teressa Lower, NP  chlorpheniramine-HYDROcodone (TUSSIONEX PENNKINETIC ER) 10-8 MG/5ML LQCR Take 5 mLs by mouth every 12 (twelve) hours as needed for cough. 11/27/12   Teressa Lower, NP  fluticasone (FLONASE) 50 MCG/ACT nasal spray Place 2 sprays into both nostrils daily. 01/22/13   Elpidio Anis, PA-C  HYDROcodone-acetaminophen (HYCET) 7.5-325 mg/15 ml solution Take 10 mLs by mouth 4 (four) times daily as needed for moderate pain. 01/22/13   Elpidio Anis, PA-C  ibuprofen (ADVIL,MOTRIN) 200 MG tablet Take 800 mg by mouth every 6 (six) hours as needed. For pain     [provider]  loratadine (CLARITIN) 10 MG tablet Take 10 mg by mouth daily.      [provider]  LORazepam (ATIVAN) 1 MG tablet Take 1-2 tablets (1-2 mg total) by mouth at bedtime as needed (for sleep). 11/13/11   Molpus, John, MD  metroNIDAZOLE (FLAGYL) 500 MG tablet Take 1 tablet (500 mg total) by mouth 2 (two) times daily. 01/14/12   Elson Areas, PA-C  polyethylene glycol powder (GLYCOLAX/MIRALAX) powder Take 17 g by mouth daily. 08/11/12  Renne Crigler, PA-C  predniSONE (DELTASONE) 10 MG tablet 6,5,4,3,2,1 taper 01/22/13   Elpidio Anis, PA-C  pseudoephedrine (SUDAFED) 60 MG tablet Take 1 tablet (60 mg total) by mouth every 6 (six) hours as needed for congestion. 12/12/11   Susy Frizzle, MD  traMADol (ULTRAM) 50 MG tablet Take 1 tablet (50 mg total) by mouth every 6 (six) hours as needed for pain. 12/12/11   Susy Frizzle, MD    Family History No family history on file.  Social History Social History   Tobacco Use  . Smoking status: Current Every Day Smoker    Packs/day: 1.00    Years: 20.00    Pack  years: 20.00    Types: Cigarettes  . Smokeless tobacco: Never Used  Substance Use Topics  . Alcohol use: No  . Drug use: No     Allergies   Patient has no known allergies.   Review of Systems Review of Systems  All other systems reviewed and are negative.    Physical Exam Updated Vital Signs BP 104/74 (BP Location: Left Arm)   Pulse 90   Temp 98.3 F (36.8 C) (Oral)   Resp 18   LMP 05/18/2017   SpO2 100%   Physical Exam  Constitutional: She appears well-developed and well-nourished.  HENT:  Head: Normocephalic and atraumatic.  Right Ear: Tympanic membrane and external ear normal.  Left Ear: Tympanic membrane and external ear normal.  Nose: Mucosal edema present. Right sinus exhibits no maxillary sinus tenderness and no frontal sinus tenderness. Left sinus exhibits no maxillary sinus tenderness and no frontal sinus tenderness.  Mouth/Throat: Uvula is midline, oropharynx is clear and moist and mucous membranes are normal. No tonsillar exudate.  No periorbital swelling or erythema  Eyes: Conjunctivae are normal. Right eye exhibits no discharge. Left eye exhibits no discharge. No scleral icterus.  Appearance: Mild conjunctival and scleral erythema that is limbic sparing. No scleral icterus. Watery discharge.  PEERL intact. EOMI without nystagmus. No photophobia or consensual photophobia.  Corneal Abrasion Exam VCO. Risks, benefits and alternatives explained. 1-2 drops of tetracaine (PONTOCAINE) 0.5 % ophthalmic solution were applied to the both eyes. Fluorescein 1 MG ophthalmic strip applied the the surface of the both eyes Wood's lamp used to screen for abrasion.  Left eye: No increased fluorescein uptake. No corneal ulcer. Negative Seidel sign. No foreign bodies noted. No visible hyphema.  Right Eye:  Small area of increased fluorescein uptake across right cornea. No corneal ulcer. Negative Seidel sign. No foreign bodies noted. No visible hyphema.  Eye flushed with  sterile saline Patient tolerated the procedure well TONOPEN: 18 LEFT, 19 RIGHT  Pulmonary/Chest: Effort normal. No respiratory distress.  Neurological: She is alert.  Skin: Skin is warm and dry. Capillary refill takes less than 2 seconds. No pallor.  No vesicular-like rash of the face  Psychiatric: She has a normal mood and affect.  Nursing note and vitals reviewed.    ED Treatments / Results  Labs (all labs ordered are listed, but only abnormal results are displayed) Labs Reviewed - No data to display  EKG None  Radiology No results found.  Procedures Procedures (including critical care time)  Medications Ordered in ED Medications  fluorescein ophthalmic strip 1 strip (has no administration in time range)  tetracaine (PONTOCAINE) 0.5 % ophthalmic solution 2 drop (has no administration in time range)     Initial Impression / Assessment and Plan / ED Course  I have reviewed the triage vital signs and the nursing notes.  Pertinent labs & imaging results that were available during my care of the patient were reviewed by me and considered in my medical decision making (see chart for details).     42 year old female that is a contact lens wearer that appears to present with initial history consistent with allergic conjunctivitis.  However patient did have an episode where her contact folded and appears to have caused a corneal abrasion last night.  This was visualized with increased floor seen uptake.  Patient states her tetanus is up-to-date.  Exam is not concerning for orbital cellulitis, hyphema, corneal ulcers.  Patient will be discharged home with ofloxacin eyedrops given she is a contact lens wearer.  Recommended that she avoid wearing her contacts over the next 1 week until symptoms resolve.  Patient understands to follow up with ophthalmology, & to return to ER if new symptoms develop including change in vision, purulent drainage, or entrapment.  Final Clinical  Impressions(s) / ED Diagnoses   Final diagnoses:  Allergic conjunctivitis of both eyes  Abrasion of right cornea, initial encounter    ED Discharge Orders        Ordered    ofloxacin (OCUFLOX) 0.3 % ophthalmic solution  4 times daily     05/25/17 1928    naphazoline-pheniramine (NAPHCON-A) 0.025-0.3 % ophthalmic solution  4 times daily PRN     05/25/17 1928       Princella Pellegrini 05/25/17 1930    Tegeler, Canary Brim, MD 05/26/17 [phone]

## 2017-05-25 NOTE — ED Triage Notes (Signed)
Eyes have been watering and itching for a few days. States after that her right eye contact was lodged under her eyelid. She was able to get the contact out.

## 2017-05-25 NOTE — Discharge Instructions (Signed)
Please use eyedrops as prescribed. Please follow-up with ophthalmology he develop any change in vision, purulent drainage or increase in pain Please do not wear your contacts over the next 1 week If you develop worsening or new concerning symptoms you can return to the emergency department for re-evaluation.
